# Patient Record
Sex: Female | Born: 1965
Health system: Southern US, Community
[De-identification: ages and names within clinical notes are randomized; demographics above are authoritative.]

## PROBLEM LIST (undated history)

## (undated) DIAGNOSIS — Z86718 Personal history of other venous thrombosis and embolism: Secondary | ICD-10-CM

## (undated) DIAGNOSIS — E739 Lactose intolerance, unspecified: Secondary | ICD-10-CM

## (undated) DIAGNOSIS — R12 Heartburn: Secondary | ICD-10-CM

## (undated) DIAGNOSIS — E538 Deficiency of other specified B group vitamins: Secondary | ICD-10-CM

## (undated) DIAGNOSIS — R079 Chest pain, unspecified: Secondary | ICD-10-CM

## (undated) DIAGNOSIS — M2550 Pain in unspecified joint: Secondary | ICD-10-CM

## (undated) DIAGNOSIS — E559 Vitamin D deficiency, unspecified: Secondary | ICD-10-CM

## (undated) DIAGNOSIS — F3281 Premenstrual dysphoric disorder: Secondary | ICD-10-CM

## (undated) DIAGNOSIS — M549 Dorsalgia, unspecified: Secondary | ICD-10-CM

## (undated) DIAGNOSIS — I10 Essential (primary) hypertension: Secondary | ICD-10-CM

## (undated) DIAGNOSIS — F419 Anxiety disorder, unspecified: Secondary | ICD-10-CM

## (undated) DIAGNOSIS — E669 Obesity, unspecified: Secondary | ICD-10-CM

## (undated) DIAGNOSIS — F32A Depression, unspecified: Secondary | ICD-10-CM

## (undated) DIAGNOSIS — M7989 Other specified soft tissue disorders: Secondary | ICD-10-CM

## (undated) DIAGNOSIS — F329 Major depressive disorder, single episode, unspecified: Secondary | ICD-10-CM

## (undated) DIAGNOSIS — R0602 Shortness of breath: Secondary | ICD-10-CM

## (undated) HISTORY — DX: Shortness of breath: R06.02

## (undated) HISTORY — DX: Major depressive disorder, single episode, unspecified: F32.9

## (undated) HISTORY — DX: Deficiency of other specified B group vitamins: E53.8

## (undated) HISTORY — DX: Dorsalgia, unspecified: M54.9

## (undated) HISTORY — DX: Anxiety disorder, unspecified: F41.9

## (undated) HISTORY — DX: Personal history of other venous thrombosis and embolism: Z86.718

## (undated) HISTORY — DX: Vitamin D deficiency, unspecified: E55.9

## (undated) HISTORY — DX: Essential (primary) hypertension: I10

## (undated) HISTORY — DX: Obesity, unspecified: E66.9

## (undated) HISTORY — DX: Premenstrual dysphoric disorder: F32.81

## (undated) HISTORY — DX: Other specified soft tissue disorders: M79.89

## (undated) HISTORY — DX: Heartburn: R12

## (undated) HISTORY — DX: Depression, unspecified: F32.A

## (undated) HISTORY — DX: Chest pain, unspecified: R07.9

## (undated) HISTORY — DX: Pain in unspecified joint: M25.50

## (undated) HISTORY — DX: Lactose intolerance, unspecified: E73.9

---

## 2003-02-24 ENCOUNTER — Ambulatory Visit (HOSPITAL_COMMUNITY): Admission: RE | Admit: 2003-02-24 | Discharge: 2003-02-24 | Payer: Self-pay | Admitting: Internal Medicine

## 2003-09-13 ENCOUNTER — Encounter: Admission: RE | Admit: 2003-09-13 | Discharge: 2003-09-13 | Payer: Self-pay | Admitting: General Surgery

## 2004-08-17 ENCOUNTER — Encounter: Admission: RE | Admit: 2004-08-17 | Discharge: 2004-08-17 | Payer: Self-pay | Admitting: General Surgery

## 2004-10-15 HISTORY — PX: GASTRIC BYPASS: SHX52

## 2004-12-15 ENCOUNTER — Other Ambulatory Visit: Admission: RE | Admit: 2004-12-15 | Discharge: 2004-12-15 | Payer: Self-pay | Admitting: Obstetrics and Gynecology

## 2005-05-10 ENCOUNTER — Encounter: Payer: Self-pay | Admitting: Cardiology

## 2005-09-10 ENCOUNTER — Encounter: Admission: RE | Admit: 2005-09-10 | Discharge: 2005-09-10 | Payer: Self-pay | Admitting: Obstetrics and Gynecology

## 2005-12-19 ENCOUNTER — Other Ambulatory Visit: Admission: RE | Admit: 2005-12-19 | Discharge: 2005-12-19 | Payer: Self-pay | Admitting: Obstetrics and Gynecology

## 2006-09-11 ENCOUNTER — Encounter: Admission: RE | Admit: 2006-09-11 | Discharge: 2006-09-11 | Payer: Self-pay | Admitting: Obstetrics and Gynecology

## 2006-12-27 ENCOUNTER — Encounter: Payer: Self-pay | Admitting: Cardiology

## 2007-10-07 ENCOUNTER — Encounter: Admission: RE | Admit: 2007-10-07 | Discharge: 2007-10-07 | Payer: Self-pay | Admitting: Obstetrics and Gynecology

## 2008-10-12 ENCOUNTER — Encounter: Admission: RE | Admit: 2008-10-12 | Discharge: 2008-10-12 | Payer: Self-pay | Admitting: Obstetrics and Gynecology

## 2009-10-31 ENCOUNTER — Encounter: Admission: RE | Admit: 2009-10-31 | Discharge: 2009-10-31 | Payer: Self-pay | Admitting: Obstetrics and Gynecology

## 2010-07-31 ENCOUNTER — Encounter (HOSPITAL_COMMUNITY)
Admission: RE | Admit: 2010-07-31 | Discharge: 2010-10-13 | Payer: Self-pay | Source: Home / Self Care | Attending: Neurology | Admitting: Neurology

## 2010-12-04 ENCOUNTER — Encounter: Payer: Self-pay | Admitting: Cardiology

## 2010-12-28 ENCOUNTER — Encounter: Payer: Self-pay | Admitting: Cardiology

## 2010-12-28 ENCOUNTER — Institutional Professional Consult (permissible substitution) (INDEPENDENT_AMBULATORY_CARE_PROVIDER_SITE_OTHER): Payer: BC Managed Care – PPO | Admitting: Cardiology

## 2010-12-28 DIAGNOSIS — R079 Chest pain, unspecified: Secondary | ICD-10-CM

## 2010-12-28 DIAGNOSIS — E669 Obesity, unspecified: Secondary | ICD-10-CM

## 2011-01-02 NOTE — Assessment & Plan Note (Signed)
Summary: consult: chestpain. notes on file from [facility]...   Visit Type:  Initial Consult Primary Provider:  Dr. Sherril Croon  CC:  some chest pressure and pt has no other complaints today.Marland Kitchen  History of Present Illness: Rebecca Wood is referred today for chest and left arm discomfort. This started about 6-8 weeks ago. It is described as a pressure slightly above the left breat. It can last for hours at times. Sometimes it is in the left arm as well. No associated nausea, vomiting, diaphoresis. No associated reflux symptoms. It is clearly not related to exertion. It eventually goes away on its own.  Her only significant risk factor is history of morbid obesity. She lost 125 lbs since gastric bypass. She is blessed with no hx of DM and her lipid profile has been remarkedly good. The last one I have was in 2006 with normal triglycerides and a HDL of 58.  Echo 12/04/10  was normal except for mil aortic insufficiency.  EKG  is normal.  Current Medications (verified): 1)  Multivitamins  Tabs (Multiple Vitamin) .... 2 Tabs  Once Daily 2)  Prilosec 20 Mg Cpdr (Omeprazole) .... Take 1 Tablet By Mouth Once A Day 3)  Ferrous Sulfate 325 (65 Fe) Mg Tabs (Ferrous Sulfate) .... Take 1 Tablet By Mouth Once A Day 4)  Citrucel  Powd (Methylcellulose (Laxative)) .... As Directed  Allergies (verified): 1)  ! Codeine   Review of Systems       negative other than HPI  Vital Signs:  Patient profile:   45 year old female Height:      63 inches Weight:      179.75 pounds BMI:     31.96 Pulse rate:   86 / minute Resp:     16 per minute BP sitting:   118 / 78  (left arm) Cuff size:   large  Vitals Entered By: Celestia Khat, CMA (December 28, 2010 3:28 PM)  Physical Exam  General:  obese.  NAD Head:  normocephalic and atraumatic Eyes:  PERRLA/EOM intact; conjunctiva and lids normal. Neck:  Neck supple, no JVD. No masses, thyromegaly or abnormal cervical nodes. Chest Rebecca Wood:  no deformities or  breast masses noted. no chest Rebecca Wood tenderness Lungs:  Clear bilaterally to auscultation and percussion. Heart:  RRR, NL S1 and S2, No murmur appreciated, no bruits Abdomen:  soft, ND,NT, no bruit Msk:  Back normal, normal gait. Muscle strength and tone normal. Pulses:  pulses normal in all 4 extremities Extremities:  No clubbing or cyanosis. Neurologic:  Alert and oriented x 3. Skin:  Intact without lesions or rashes. Psych:  Normal affect.   Impression & Recommendations:  Problem # 1:  CHEST PAIN UNSPECIFIED (ICD-786.50) Noncardiac, reassurance given. Education regarding angina or ischemic sxs given. Walking program and weight loss recommended. Orders: EKG w/ Interpretation (93000)  Patient Instructions: 1)  Your physician recommends that you schedule a follow-up appointment in: as needed with Dr. Daleen Squibb 2)  Your physician recommends that you continue on your current medications as directed. Please refer to the Current Medication list given to you today. 3)  Your physician discussed the importance of regular exercise and recommended that you start or continue a regular exercise program for good health. walk 30 minutes a day / 3 hours week 4)  Your physician encouraged you to lose weight for better health. A weight loss of 25 pounds or 1 pound per week

## 2011-03-02 NOTE — Consult Note (Signed)
NAME:  Kirk, Rebecca Wood NO.:  [id number]   MEDICAL RECORD NO.:  [id number]                  PATIENT TYPE:   LOCATION:                                       FACILITY:   PHYSICIAN:  Lionel December, M.D.                 DATE OF BIRTH:  1966/02/25   DATE OF CONSULTATION:  01/13/2003  DATE OF DISCHARGE:                                   CONSULTATION   REFERRING PHYSICIAN:  Dr. Weyman Pedro.   PRESENTING COMPLAINT:  Persistent symptoms of acid reflux.   HISTORY OF PRESENT ILLNESS:  The patient is a 45 year old Caucasian female  who is referred through the courtesy of Dr. Eliberto Ivory for GI evaluation.  She  gives a total of three-year history sour taste in her mouth, regurgitation,  and chest pain.  Symptoms have been occurring more and more frequently  lately.  She has taken Pepcid, Aciphex, Prevacid, and Protonix.  She  indicates her symptoms are never controlled to her satisfaction.  She thinks  Aciphex worked the best.  She has changed her eating habits completely.  She  states she has lost 30 pounds in the last six months.  Lately she has had  dysphagia with solids.  She also has regurgitation.  She describes chest  pain to be a heavy feeling that occurs virtually every day and lasts for  several minutes.  It is not associated with shortness of breath.  She also  has noted intermittent hoarseness and feels a frog in her throat.  She  does give a history of intermittent dark or black stools.  She denies  abdominal pain, odynophagia, nausea, or vomiting.  In November 2003 she had  normal upper abdominal ultrasound and a normal upper GI series performed at  [facility].   CURRENT MEDICATIONS:  1. MVI daily.  2. Wheat germ daily.  3. Bactrim one daily which she just started recently for acne.  4. Fat burner one daily which was begun two months ago and she does not feel     it is making her symptoms worse.  5. She occasionally uses hydrocodone for migraine.   PAST MEDICAL HISTORY:  She has acne and migraines.  She has never had any  surgeries.   ALLERGIES:  Rebecca Wood known.   FAMILY HISTORY:  Mother died of metastatic renal carcinoma at age 25.  She  died within a year of diagnosis.  Father is in fair health; so is one sister  and two brothers.   SOCIAL HISTORY:  She was married two years ago.  She has one stepson.  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  She has never smoked  cigarettes and does not drink alcohol.   PHYSICAL EXAMINATION:  GENERAL:  Pleasant, moderately-obese Caucasian female  who is in no acute distress.  VITAL SIGNS:  She weighs 240.5 pounds, she is 5 feet 3  inches tall.  Pulse  92 per minute, blood pressure 100/80, temperature 98.1.  HEENT:  Conjunctivae are pink, sclerae nonicteric.  Oropharyngeal mucosa is  normal, dentition in satisfactory condition.  NECK:  Without masses or thyromegaly.  CARDIAC:  Regular rhythm, normal S1 and S2.  No murmur or gallop noted.  LUNGS:  Clear to auscultation.  ABDOMEN:  Obese but soft without organomegaly or masses.  She has mild  midepigastric tenderness.  RECTAL:  Deferred.  EXTREMITIES:  She does not have peripheral edema or clubbing.   ASSESSMENT:  The patient is a 45 year old Caucasian female with total of  three-year history of symptoms typical of gastroesophageal reflux disease  who has not had significant relief with acid suppression and lifestyle  modifications.  Recent upper abdominal ultrasound and upper GI series were  within normal limits.  She also has dysphagia and therefore may have  developed a distal ring or a stricture.  As she has not responded to therapy  and she has dysphagia, one also has to be concerned about esophageal  motility disorder.  Even though upper GI series was normal her esophagus  needs to be examined by endoscopy to determine whether or not she has  mucosal disease.   RECOMMENDATIONS:  Antireflux measures reinforced.  Will give her  a trial  with Protonix 40 mg p.o. b.i.d. - two weeks supply given.  She will return  for EGD and dilatation to be performed at [facility].  Further recommendation will  be made based on endoscopic findings.  We will also give her hemoccult and  ask her to bring a stool smear when she notices black stools.   I would like to thank Dr. Eliberto Ivory for the opportunity to participate in the  care of this nice lady.                                               Lionel December, M.D.    NR/MEDQ  D:  01/13/2003  T:  01/14/2003  Job:  [id number]   cc:   Weyman Pedro, M.D.  [place], [place]   [facility]

## 2011-03-02 NOTE — Op Note (Signed)
NAME:  Rebecca Wood, Rebecca Wood                            ACCOUNT NO.:  [id number]   MEDICAL RECORD NO.:  [id number]                   PATIENT TYPE:  AMB   LOCATION:  DAY                                  FACILITY:  [facility]   PHYSICIAN:  Lionel December, M.D.                 DATE OF BIRTH:  18-Oct-1965   DATE OF PROCEDURE:  02/24/2003  DATE OF DISCHARGE:                                 OPERATIVE REPORT   PROCEDURE:  Esophagogastroduodenoscopy.   INDICATIONS FOR PROCEDURE:  The patient is a 45 year old Caucasian female  with symptoms of gastroesophageal reflux disease who has not responded to  therapy.  She has heartburn, chest pain, dysphagia, as well as nausea.  She  has been tried on multiple PPIs without good control of her  symptoms.  She is undergoing diagnostic EGD.  The procedure was reviewed  with the patient, and informed consent was obtained.   PREOPERATIVE MEDICATIONS:  Cetacaine spray for oropharyngeal topical  anesthesia, Demerol 50 mg IV, Versed 10 mg IV in divided doses.   INSTRUMENT USED:  Olympus video system.   FINDINGS:  The procedure was performed in the endoscopy suite.  The  patient's vital signs and O2 saturations were monitored during the procedure  and remained stable.  The patient was placed in the left lateral position.  The endoscope was passed via the oropharynx without any difficulty into the  esophagus.   Esophagus:  The mucosa of the esophagus was normal.  She had erythema at the  gastroesophageal junction.  No hernia, ring, or stricture was noted.  The  cervical esophagus was examined on the way out and appeared to be normal.   Stomach:  It was empty and distended very well with insufflation.  The folds  of the proximal stomach were normal.  Examination of the mucosa revealed  patchy erythema at the antrum, but no erosions or ulcers were noted.  The  pyloric channel was patent.  The angularis, fundus, and cardia were examined  by retroflexing the scope and were  normal.   Duodenum:  Examination of the bulb and postbulbar duodenum was normal.  The  endoscope was withdrawn.  The patient tolerated the procedure well.   The esophagus was dilated by passing a 56 [place] Maloney dilator through the  esophagus to full insertion, given history of dysphagia.  As the dilatation  was completed, the endoscope was passed again, and there was a small linear  tear in the cervical esophagus, indicating an unrecognized web.  Pictures  were taken for the record.  The endoscope was withdrawn.  The patient  tolerated the procedure well.   FINAL DIAGNOSES:  1. Margins of reflux esophagitis limited to gastroesophageal junction.  No     evidence of erosive esophagitis or hernia.  2. Nonerosive antral gastritis.  3. Esophageal dilatation resulted in small tear in cervical esophagus,  suggesting an esophageal web.   RECOMMENDATIONS:  1. She will continue antireflux measures and Nexium as before.  2. H pylori serology will be checked.  If it is positive, she will be     treated; otherwise, we will bring her back for a solid phase gastric     emptying study.                                               Lionel December, M.D.    NR/MEDQ  D:  02/24/2003  T:  02/25/2003  Job:  [id number]   cc:   Eliberto Ivory, M.D.  [place], [place]

## 2011-04-04 ENCOUNTER — Other Ambulatory Visit: Payer: Self-pay | Admitting: Obstetrics and Gynecology

## 2011-04-04 DIAGNOSIS — Z1231 Encounter for screening mammogram for malignant neoplasm of breast: Secondary | ICD-10-CM

## 2011-04-09 ENCOUNTER — Ambulatory Visit: Payer: BC Managed Care – PPO

## 2011-04-16 ENCOUNTER — Ambulatory Visit: Payer: BC Managed Care – PPO

## 2012-01-23 ENCOUNTER — Ambulatory Visit
Admission: RE | Admit: 2012-01-23 | Discharge: 2012-01-23 | Disposition: A | Discharge status: Home / Self Care | Payer: BC Managed Care – PPO | Source: Ambulatory Visit | Attending: Obstetrics and Gynecology | Admitting: Obstetrics and Gynecology

## 2012-01-23 DIAGNOSIS — Z1231 Encounter for screening mammogram for malignant neoplasm of breast: Secondary | ICD-10-CM

## 2012-04-21 DIAGNOSIS — E669 Obesity, unspecified: Secondary | ICD-10-CM | POA: Insufficient documentation

## 2012-04-21 DIAGNOSIS — F3281 Premenstrual dysphoric disorder: Secondary | ICD-10-CM | POA: Insufficient documentation

## 2012-04-23 ENCOUNTER — Ambulatory Visit: Payer: Self-pay | Admitting: Obstetrics and Gynecology

## 2012-12-15 ENCOUNTER — Encounter: Payer: Self-pay | Admitting: Obstetrics and Gynecology

## 2012-12-15 ENCOUNTER — Ambulatory Visit: Payer: BC Managed Care – PPO | Admitting: Obstetrics and Gynecology

## 2012-12-15 VITALS — BP 136/72 | Ht 62.75 in | Wt 186.0 lb

## 2012-12-15 DIAGNOSIS — Z01419 Encounter for gynecological examination (general) (routine) without abnormal findings: Secondary | ICD-10-CM

## 2012-12-15 DIAGNOSIS — Z9884 Bariatric surgery status: Secondary | ICD-10-CM

## 2012-12-15 DIAGNOSIS — E669 Obesity, unspecified: Secondary | ICD-10-CM

## 2012-12-15 DIAGNOSIS — R109 Unspecified abdominal pain: Secondary | ICD-10-CM

## 2012-12-15 DIAGNOSIS — Z124 Encounter for screening for malignant neoplasm of cervix: Secondary | ICD-10-CM

## 2012-12-15 DIAGNOSIS — R5383 Other fatigue: Secondary | ICD-10-CM

## 2012-12-15 NOTE — Progress Notes (Signed)
The patient reports: staying fatigued   Contraception:Husband had vasectomy  Last mammogram:01/24/2012 Last pap:04/09/2011  GC/Chlamydia cultures offered: declined HIV/RPR/HbsAg offered:  declined HSV 1 and 2 glycoprotein offered: declined  Menstrual cycle regular and monthly: yes pt states is noticing a little change. Pt states cycle in January was fine and February was off x 1 week. Pt states last June in came twice in that month. Menstrual flow normal: Yes  Urinary symptoms: has frequent UTIs  Normal bowel movements: Yes Reports abuse at home: No:   Subjective:    Rebecca Wood is a 47 y.o. female, G0P0, who presents for an annual exam.     History   Social History  . Marital Status: Married    Spouse Name: N/A    Number of Children: N/A  . Years of Education: N/A   Social History Main Topics  . Smoking status: Never Smoker   . Smokeless tobacco: Never Used  . Alcohol Use: No  . Drug Use: No  . Sexually Active: Yes    Birth Control/ Protection: Surgical     Comment: VAS   Other Topics Concern  . None   Social History Narrative  . None    Menstrual cycle:   LMP: Patient's last menstrual period was 12/08/2012.           Cycle: every 28 days, noticed about 3 times last year she had some irregularity. Cycle was 1 week late in February 2014. Cycle heaviest on first 2 days. Cramping in lower abdomin Pain scale 9/10, doesn't take any medication. When she has BM pain decreases. Dime size clotting.   The following portions of the patient's history were reviewed and updated as appropriate: allergies, current medications, past family history, past medical history, past social history, past surgical history and problem list.  Review of Systems Pertinent items are noted in HPI. Breast:Negative for breast lump,nipple discharge or nipple retraction Gastrointestinal: Negative for abdominal pain, change in bowel habits or rectal bleeding Urinary:negative   Objective:    BP  136/72  Ht 5' 2.75" (1.594 m)  Wt 186 lb (84.369 kg)  BMI 33.21 kg/m2  LMP 12/08/2012    Weight:  Wt Readings from Last 1 Encounters:  12/28/10 179 lb 12 oz (81.534 kg)          BMI: Body mass index is 33.21 kg/(m^2).  General Appearance: Alert, appropriate appearance for age. No acute distress HEENT: Grossly normal Neck / Thyroid: Supple, no masses, nodes or enlargement Lungs: clear to auscultation bilaterally Back: No CVA tenderness Breast Exam: No masses or nodes.No dimpling, nipple retraction or discharge. Cardiovascular: Regular rate and rhythm. S1, S2, no murmur Gastrointestinal: Soft, non-tender, no masses or organomegaly Pelvic Exam: Vulva and vagina appear normal. Bimanual exam reveals normal uterus and adnexa. Rectovaginal: normal rectal, no masses Lymphatic Exam: Non-palpable nodes in neck, clavicular, axillary, or inguinal regions  Skin: no rash or abnormalities Neurologic: Normal gait and speech, no tremor  Psychiatric: Alert and oriented, appropriate affect.   Assessment:    Normal gyn exam    Plan:    mammogram pap smear return annually or prn STD screening: declined Contraception:vasectomy Thyroid with TSH, Lipid Panel, Vitamin D, CMP, CBC Pelvic U/S at next visit for Pelvic pain Referral to Dr. Carollee Leitz Rivard MD

## 2012-12-16 LAB — PAP IG W/ RFLX HPV ASCU

## 2012-12-17 LAB — HUMAN PAPILLOMAVIRUS, HIGH RISK: HPV DNA High Risk: NOT DETECTED

## 2012-12-18 ENCOUNTER — Encounter: Payer: Self-pay | Admitting: Obstetrics and Gynecology

## 2012-12-18 NOTE — Progress Notes (Signed)
Quick Note:  Please send ASCUS letter. Pap in 1 year. ______ 

## 2012-12-26 ENCOUNTER — Other Ambulatory Visit: Payer: Self-pay | Admitting: Obstetrics and Gynecology

## 2012-12-26 DIAGNOSIS — R109 Unspecified abdominal pain: Secondary | ICD-10-CM

## 2012-12-26 DIAGNOSIS — R5381 Other malaise: Secondary | ICD-10-CM

## 2012-12-26 DIAGNOSIS — E669 Obesity, unspecified: Secondary | ICD-10-CM

## 2012-12-27 ENCOUNTER — Other Ambulatory Visit: Payer: Self-pay | Admitting: Obstetrics and Gynecology

## 2012-12-27 LAB — CBC
HCT: 39.8 % (ref 36.0–46.0)
Hemoglobin: 13.5 g/dL (ref 12.0–15.0)
MCH: 28.2 pg (ref 26.0–34.0)
MCHC: 33.9 g/dL (ref 30.0–36.0)
MCV: 83.3 fL (ref 78.0–100.0)
Platelets: 404 10*3/uL — ABNORMAL HIGH (ref 150–400)
RBC: 4.78 MIL/uL (ref 3.87–5.11)
RDW: 13.4 % (ref 11.5–15.5)
WBC: 6.3 10*3/uL (ref 4.0–10.5)

## 2012-12-27 LAB — LIPID PANEL
Cholesterol: 161 mg/dL (ref 0–200)
HDL: 62 mg/dL (ref >39)
LDL Cholesterol: 75 mg/dL (ref 0–99)
Total CHOL/HDL Ratio: 2.6 Ratio
Triglycerides: 120 mg/dL (ref <150)
VLDL: 24 mg/dL (ref 0–40)

## 2012-12-27 LAB — THYROID PANEL WITH TSH
Free Thyroxine Index: 2.7 (ref 1.0–3.9)
T3 Uptake: 32.8 % (ref 22.5–37.0)
T4, Total: 8.2 ug/dL (ref 5.0–12.5)
TSH: 1.054 u[IU]/mL (ref 0.350–4.500)

## 2013-01-09 ENCOUNTER — Other Ambulatory Visit: Payer: Self-pay | Admitting: Obstetrics and Gynecology

## 2013-01-09 LAB — COMPREHENSIVE METABOLIC PANEL
ALT: 22 U/L (ref 0–35)
AST: 23 U/L (ref 0–37)
Albumin: 4.1 g/dL (ref 3.5–5.2)
Alkaline Phosphatase: 77 U/L (ref 39–117)
BUN: 10 mg/dL (ref 6–23)
CO2: 29 mEq/L (ref 19–32)
Calcium: 8.7 mg/dL (ref 8.4–10.5)
Chloride: 104 mEq/L (ref 96–112)
Creat: 0.59 mg/dL (ref 0.50–1.10)
Glucose, Bld: 88 mg/dL (ref 70–99)
Potassium: 4.7 mEq/L (ref 3.5–5.3)
Sodium: 141 mEq/L (ref 135–145)
Total Bilirubin: 0.8 mg/dL (ref 0.3–1.2)
Total Protein: 6.5 g/dL (ref 6.0–8.3)

## 2013-01-10 LAB — VITAMIN D 25 HYDROXY (VIT D DEFICIENCY, FRACTURES): Vit D, 25-Hydroxy: 26 ng/mL — ABNORMAL LOW (ref 30–89)

## 2013-01-10 LAB — FOLATE: Folate: 13.7 ng/mL

## 2013-01-10 LAB — VITAMIN B12: Vitamin B-12: 594 pg/mL (ref 211–911)

## 2013-06-04 ENCOUNTER — Other Ambulatory Visit: Payer: Self-pay

## 2013-06-04 DIAGNOSIS — Z1231 Encounter for screening mammogram for malignant neoplasm of breast: Secondary | ICD-10-CM

## 2013-06-10 ENCOUNTER — Ambulatory Visit: Payer: BC Managed Care – PPO

## 2013-06-17 ENCOUNTER — Ambulatory Visit
Admission: RE | Admit: 2013-06-17 | Discharge: 2013-06-17 | Disposition: A | Discharge status: Home / Self Care | Payer: BC Managed Care – PPO | Source: Ambulatory Visit

## 2013-06-17 DIAGNOSIS — Z1231 Encounter for screening mammogram for malignant neoplasm of breast: Secondary | ICD-10-CM

## 2014-01-06 ENCOUNTER — Telehealth: Payer: Self-pay | Admitting: Internal Medicine

## 2014-01-06 NOTE — Telephone Encounter (Signed)
01-06-14 258pm, former pt of Dr. Daleen SquibbWall with recent abnormal echo, per Dr. Johney FrameAllred pt needs to re-est care with  A primary cardiologist, [place] in [place] so [facility], sent staff message to Carles Colleterri Stewart to make pt appt and [facility].

## 2014-01-27 ENCOUNTER — Other Ambulatory Visit: Payer: Self-pay | Admitting: General Surgery

## 2014-01-27 ENCOUNTER — Other Ambulatory Visit: Payer: Self-pay | Admitting: Endocrinology

## 2014-01-27 DIAGNOSIS — E211 Secondary hyperparathyroidism, not elsewhere classified: Secondary | ICD-10-CM

## 2014-01-27 DIAGNOSIS — E041 Nontoxic single thyroid nodule: Secondary | ICD-10-CM

## 2014-01-29 ENCOUNTER — Other Ambulatory Visit: Payer: Self-pay | Admitting: Endocrinology

## 2014-01-29 DIAGNOSIS — E211 Secondary hyperparathyroidism, not elsewhere classified: Secondary | ICD-10-CM

## 2014-02-02 ENCOUNTER — Inpatient Hospital Stay
Admission: RE | Admit: 2014-02-02 | Discharge: 2014-02-02 | Disposition: A | Discharge status: Home / Self Care | Payer: Self-pay | Source: Ambulatory Visit | Attending: Interventional Radiology | Admitting: Interventional Radiology

## 2014-02-02 ENCOUNTER — Other Ambulatory Visit (HOSPITAL_COMMUNITY): Payer: Self-pay | Admitting: Interventional Radiology

## 2014-02-02 DIAGNOSIS — E041 Nontoxic single thyroid nodule: Secondary | ICD-10-CM

## 2014-02-03 ENCOUNTER — Encounter (INDEPENDENT_AMBULATORY_CARE_PROVIDER_SITE_OTHER): Payer: Self-pay

## 2014-02-03 ENCOUNTER — Ambulatory Visit
Admission: RE | Admit: 2014-02-03 | Discharge: 2014-02-03 | Disposition: A | Discharge status: Home / Self Care | Payer: Self-pay | Source: Ambulatory Visit | Attending: Endocrinology | Admitting: Endocrinology

## 2014-02-03 ENCOUNTER — Other Ambulatory Visit (HOSPITAL_COMMUNITY)
Admission: RE | Admit: 2014-02-03 | Discharge: 2014-02-03 | Disposition: A | Discharge status: Home / Self Care | Payer: BC Managed Care – PPO | Source: Ambulatory Visit | Attending: Interventional Radiology | Admitting: Interventional Radiology

## 2014-02-03 ENCOUNTER — Ambulatory Visit
Admission: RE | Admit: 2014-02-03 | Discharge: 2014-02-03 | Disposition: A | Discharge status: Home / Self Care | Payer: BC Managed Care – PPO | Source: Ambulatory Visit | Attending: Endocrinology | Admitting: Endocrinology

## 2014-02-03 DIAGNOSIS — E211 Secondary hyperparathyroidism, not elsewhere classified: Secondary | ICD-10-CM

## 2014-02-03 DIAGNOSIS — E041 Nontoxic single thyroid nodule: Secondary | ICD-10-CM

## 2014-02-05 ENCOUNTER — Encounter: Payer: Self-pay | Admitting: Cardiology

## 2014-02-05 ENCOUNTER — Ambulatory Visit (INDEPENDENT_AMBULATORY_CARE_PROVIDER_SITE_OTHER): Payer: BC Managed Care – PPO | Admitting: Cardiology

## 2014-02-05 VITALS — BP 124/72 | HR 84 | Ht 62.75 in | Wt 198.0 lb

## 2014-02-05 DIAGNOSIS — R222 Localized swelling, mass and lump, trunk: Secondary | ICD-10-CM

## 2014-02-05 DIAGNOSIS — Z136 Encounter for screening for cardiovascular disorders: Secondary | ICD-10-CM

## 2014-02-05 DIAGNOSIS — Z13220 Encounter for screening for lipoid disorders: Secondary | ICD-10-CM

## 2014-02-05 DIAGNOSIS — R079 Chest pain, unspecified: Secondary | ICD-10-CM

## 2014-02-05 DIAGNOSIS — R002 Palpitations: Secondary | ICD-10-CM

## 2014-02-05 DIAGNOSIS — I5189 Other ill-defined heart diseases: Secondary | ICD-10-CM

## 2014-02-05 NOTE — Patient Instructions (Signed)
Your physician has recommended that you wear a holter monitor. Holter monitors are medical devices that record the heart's electrical activity. Doctors most often use these monitors to diagnose arrhythmias. Arrhythmias are problems with the speed or rhythm of the heartbeat. The monitor is a small, portable device. You can wear one while you do your normal daily activities. This is usually used to diagnose what is causing palpitations/syncope (passing out).  Your physician has requested that you have a cardiac MRI. Cardiac MRI uses a computer to create images of your heart as its beating, producing both still and moving pictures of your heart and major blood vessels. For further information please visit [email or web address]. Please follow the instruction sheet given to you today for more information.  Your physician recommends that you return for a FASTING NMR LIPO WITH LIPIDS on  Your physician wants you to follow-up in: 1 month with Dr. Shirlee LatchMclean. You will receive a reminder letter in the mail two months in advance. If you don't receive a letter, please call our office to schedule the follow-up appointment.

## 2014-02-07 NOTE — Progress Notes (Signed)
Patient ID: Rebecca LodgeRobin Wood, female   DOB: 05/07/1966, 48 y.o.   MRN: [id number] PCP: Dr. Sherril CroonVyas  48 yo with history of prior gastric bypass and prior workup for atypical chest pain presents for cardiology followup after having and abnormal echo.  She has been seen by Dr. Daleen SquibbWall in the past and is seen by me for the first time today.  She had an echo done in 3/15, apparently because of mild aortic insufficiency noted on an echo done in 2/12.  This was not done at our office so I do not have the images to review.  The report mentions a "hyperechoic density in the left atrium."  It was recommended that she have further workup of this.  She has not history of stroke-like or TIA-like symptoms.   She has mild exertional dyspnea walking up steps.  She has generalized fatigue.  She has occasional palpitations with HR increasing to 100s-120s at times.  No lightheadedness or syncope.  She does not typically have exertional chest pain.  She did have 1 recent episode of chest pain after sex.    ECG: NSR, normal  Labs (3/15): TSH normal, K 4.8, creatinine 0.67, HCT 38.4, LDL 72, HDL 60  PMH: 1. Obesity s/p gastric bypass 2. GERD 3. Echo (2/12) with EF 60-65%, mild AI.  Echo (3/15) with EF 60-65%, mild AI, hyperechoic density in left atrium.  4. Elevated PTH level with low normal calcium.  5. History of atypical chest pain  SH: Married, [place], nonsmoker.  FH: Father with MI at 5753, brother with MI in his 4230s.   ROS: All systems reviewed and negative except as per HPI.   Current Outpatient Prescriptions  Medication Sig Dispense Refill  . Calcium-Magnesium-Vitamin D (CALCIUM 500 PO) Take by mouth 3 (three) times daily.      . Cholecalciferol (VITAMIN D-3 PO) Take 200 mg by mouth 3 (three) times daily.      . ferrous sulfate 325 (65 FE) MG EC tablet Take 325 mg by mouth daily with breakfast.       . Methylcellulose, Laxative, (CITRUCEL PO) Take by mouth 3 (three) times daily as needed.      .  Multiple Vitamin (MULTIVITAMIN) tablet Take 1 tablet by mouth 2 (two) times daily.      Marland Kitchen. omeprazole (PRILOSEC) 10 MG capsule Take 20 mg by mouth daily.       No current facility-administered medications for this visit.    BP 124/72  Pulse 84  Ht 5' 2.75" (1.594 m)  Wt 89.812 kg (198 lb)  BMI 35.35 kg/m2 General: NAD Neck: No JVD, no thyromegaly or thyroid nodule.  Lungs: Clear to auscultation bilaterally with normal respiratory effort. CV: Nondisplaced PMI.  Heart regular S1/S2, no S3/S4, no murmur.  No peripheral edema.  No carotid bruit.  Normal pedal pulses.  Abdomen: Soft, nontender, no hepatosplenomegaly, no distention.  Skin: Intact without lesions or rashes.  Neurologic: Alert and oriented x 3.  Psych: Normal affect. Extremities: No clubbing or cyanosis.  HEENT: Normal.   Assessment/Plan: 1. Echo abnormality: Last echo done at outside facility showed "hyperechoic density in the left atrium."  Further workup was recommended.  I do not have the images to review.  This may be artifact but cannot be sure.  She has had no symptoms suggestive of TIA or stroke.  No history of atrial fibrillation to suggest LA thrombus.  - I will get a cardiac MRI to assess for mass/thrombus in the left  atrium.  - In the future, I told her that if echo is needed, should be done in this office so that images can be directly reviewed.  2. Palpitations: I will have her wear a 48 hour holter to see if she has arrhythmia such as atrial fibrillation.  She has some palpitations on most days.   3. Family history of premature CAD: Father and brother.  LDL was not high when recently checked.  Given her family history, I will get a Lipomed profile to assess LDL particle number.    Followup in 1 month.   Laurey MoraleDalton S McLean 02/08/2014

## 2014-02-08 DIAGNOSIS — R002 Palpitations: Secondary | ICD-10-CM | POA: Insufficient documentation

## 2014-02-08 DIAGNOSIS — I5189 Other ill-defined heart diseases: Secondary | ICD-10-CM | POA: Insufficient documentation

## 2014-02-11 ENCOUNTER — Encounter: Payer: Self-pay | Admitting: Cardiology

## 2014-02-11 ENCOUNTER — Encounter: Payer: Self-pay | Admitting: *Deleted

## 2014-02-11 ENCOUNTER — Other Ambulatory Visit: Payer: BC Managed Care – PPO

## 2014-02-11 ENCOUNTER — Encounter (INDEPENDENT_AMBULATORY_CARE_PROVIDER_SITE_OTHER): Payer: BC Managed Care – PPO

## 2014-02-11 DIAGNOSIS — Z136 Encounter for screening for cardiovascular disorders: Principal | ICD-10-CM

## 2014-02-11 DIAGNOSIS — Z13220 Encounter for screening for lipoid disorders: Secondary | ICD-10-CM

## 2014-02-11 DIAGNOSIS — R002 Palpitations: Secondary | ICD-10-CM

## 2014-02-11 NOTE — Progress Notes (Signed)
Patient ID: Rebecca LodgeRobin Wood, female   DOB: 1966/09/13, 48 y.o.   MRN: [id number] EVO 48 hour holter monitor applied to patient.

## 2014-02-12 LAB — NMR LIPOPROFILE WITH LIPIDS
Cholesterol, Total: 168 mg/dL (ref <200)
HDL Particle Number: 41.6 umol/L (ref >=30.5)
HDL Size: 9.9 nm (ref >=9.2)
HDL-C: 75 mg/dL (ref >=40)
LDL (calc): 67 mg/dL (ref <100)
LDL Particle Number: 706 nmol/L (ref <1000)
LDL Size: 21.4 nm (ref >20.5)
LP-IR Score: 31 (ref <=45)
Large HDL-P: 12.7 umol/L (ref >=4.8)
Large VLDL-P: 4.4 nmol/L — ABNORMAL HIGH (ref <=2.7)
Small LDL Particle Number: 248 nmol/L (ref <=527)
Triglycerides: 130 mg/dL (ref <150)
VLDL Size: 49.6 nm — ABNORMAL HIGH (ref <=46.6)

## 2014-02-17 ENCOUNTER — Ambulatory Visit (HOSPITAL_COMMUNITY)
Admission: RE | Admit: 2014-02-17 | Discharge: 2014-02-17 | Disposition: A | Discharge status: Home / Self Care | Payer: BC Managed Care – PPO | Source: Ambulatory Visit | Attending: Cardiology | Admitting: Cardiology

## 2014-02-17 DIAGNOSIS — R079 Chest pain, unspecified: Secondary | ICD-10-CM

## 2014-02-17 DIAGNOSIS — I5189 Other ill-defined heart diseases: Secondary | ICD-10-CM

## 2014-02-17 LAB — CREATININE, SERUM
Creatinine, Ser: 0.68 mg/dL (ref 0.50–1.10)
GFR calc Af Amer: >90 mL/min (ref >90)
GFR calc non Af Amer: >90 mL/min (ref >90)

## 2014-02-17 MED ORDER — GADOBENATE DIMEGLUMINE 529 MG/ML IV SOLN: 30.0000 mL | Freq: Once | INTRAVENOUS | Status: DC

## 2014-02-18 ENCOUNTER — Telehealth: Payer: Self-pay | Admitting: *Deleted

## 2014-02-18 NOTE — Telephone Encounter (Signed)
Message copied by Jacqlyn KraussLANKFORD, ANNE M on Thu Feb 18, 2014  8:57 AM ------      Message from: Sherrilyn RistAVIS, GESILA C      Created: Thu Feb 18, 2014  8:16 AM      Regarding: FW: CARDIAC MRI       Thurston Holenne, please read email below.             gesila       ----- Message -----         From: Terri PiedraJulia Lynette Thomas Piercy         Sent: 02/17/2014  10:45 AM           To: Sherrilyn RistGesila C Davis      Subject: RE: CARDIAC MRI                                          Hey girl,  Alexandria LodgeRobin Santoro came this morning and attempted the MRI--she is claustrophobic and we rescheduled to 5/19 at 3pm.   This was the earliest we could  Do based on our and her schedule(has beach trip week before)  She will need meds to take prior if you get her an order for something.  She also has an appt schedule with Dr. Shirlee LatchMclean on the 28th and may to need to reschedule that since she will not have had this yet.  She will be in touch with the office.  Thanks Lynnette      ----- Message -----         From: Sherrilyn RistGesila C Davis         Sent: 02/09/2014   4:16 PM           To: Terri PiedraJulia Lynette Thomas Piercy      Subject: FW: CARDIAC MRI                                          Mclean: 5/6, 5/29, 6/2, 6/3, 6/8, 6/24, 6/26, 6/29, 7/1, 8/10, 8/11, 8/18,             gesila        ----- Message -----         From: Gerome Sameborah D Miller         Sent: 02/05/2014   4:20 PM           To: Sherrilyn RistGesila C Davis, [facility] [address] [facility] [address] Pre Cert/Auth      Subject: CARDIAC MRI                                              02/05/14 See orders.            Thanks!                   ------

## 2014-02-18 NOTE — Telephone Encounter (Signed)
I will forward to Dr Shirlee LatchMcLean for order for medication prior to Cardiac MRI--pt claustrophobic.

## 2014-02-18 NOTE — Telephone Encounter (Signed)
Pt.notified

## 2014-02-18 NOTE — Telephone Encounter (Signed)
Can have Valium 2.5 mg x 1 to take 1/2 hour before MRI.

## 2014-02-22 ENCOUNTER — Telehealth: Payer: Self-pay | Admitting: Cardiology

## 2014-02-22 NOTE — Telephone Encounter (Signed)
Dr. Alford HighlandMclean's Interpretation of 48 hour holter monitor: No significant abnormalities.  Pt aware.

## 2014-03-05 ENCOUNTER — Ambulatory Visit: Payer: BC Managed Care – PPO | Admitting: Cardiology

## 2014-03-11 ENCOUNTER — Ambulatory Visit: Payer: BC Managed Care – PPO | Admitting: Cardiology

## 2014-03-12 ENCOUNTER — Ambulatory Visit (HOSPITAL_COMMUNITY)
Admission: RE | Admit: 2014-03-12 | Discharge: 2014-03-12 | Disposition: A | Discharge status: Home / Self Care | Payer: BC Managed Care – PPO | Source: Ambulatory Visit | Attending: Cardiology | Admitting: Cardiology

## 2014-03-12 DIAGNOSIS — R079 Chest pain, unspecified: Secondary | ICD-10-CM

## 2014-03-12 DIAGNOSIS — I359 Nonrheumatic aortic valve disorder, unspecified: Secondary | ICD-10-CM | POA: Insufficient documentation

## 2014-03-12 MED ORDER — GADOBENATE DIMEGLUMINE 529 MG/ML IV SOLN
30.0000 mL | Freq: Once | INTRAVENOUS | Status: AC
  Administered 2014-03-12: 30 mL via INTRAVENOUS

## 2014-03-15 ENCOUNTER — Encounter: Payer: Self-pay | Admitting: Cardiology

## 2014-03-15 ENCOUNTER — Ambulatory Visit (INDEPENDENT_AMBULATORY_CARE_PROVIDER_SITE_OTHER): Payer: BC Managed Care – PPO | Admitting: Cardiology

## 2014-03-15 VITALS — BP 130/78 | HR 76 | Ht 63.0 in | Wt 199.0 lb

## 2014-03-15 DIAGNOSIS — R002 Palpitations: Secondary | ICD-10-CM

## 2014-03-15 DIAGNOSIS — I5189 Other ill-defined heart diseases: Secondary | ICD-10-CM

## 2014-03-15 NOTE — Patient Instructions (Signed)
Your physician recommends that you schedule a follow-up appointment as needed with Dr McLean.  

## 2014-03-15 NOTE — Progress Notes (Signed)
Patient ID: Rebecca Wood, female   DOB: 1966/06/22, 48 y.o.   MRN: [id number] PCP: Dr. Sherril Croon  48 y.o. with history of prior gastric bypass and prior workup for atypical chest pain was seen at last appointment for cardiology followup after having had an abnormal echo. She had an echo done in 3/15, apparently because of mild aortic insufficiency noted on an echo done in 2/12.  This was not done at our office so I do not have the images to review.  The report mentions a "hyperechoic density in the left atrium."  It was recommended that she have further workup of this.  She has not history of stroke-like or TIA-like symptoms.   She has mild exertional dyspnea walking up steps.  She has generalized fatigue.  She has occasional palpitations with HR increasing to 100s-120s at times.  No lightheadedness or syncope.  She does not typically have exertional chest pain.    Cardiac MRI was done to evaluate possible LA mass.  There was no evidence for mass, aortic insufficiency was mild. Holter monitor showed no significant abnormalities.   Labs (3/15): TSH normal, K 4.8, creatinine 0.67, HCT 38.4, LDL 72, HDL 60 Labs (5/15): LDL particle number 706, LDL 67  PMH: 1. Obesity s/p gastric bypass 2. GERD 3. Echo (2/12) with EF 60-65%, mild AI.  Echo (3/15) with EF 60-65%, mild AI, hyperechoic density in left atrium.  Cardiac MRI (5/15) with EF 61%, mild AI, no left atrial mass.  4. Elevated PTH level with low normal calcium.  5. History of atypical chest pain 6. Palpitations: Holter (4/15) with no significant abnormality.   SH: Married, lives in [place], nonsmoker.  FH: Father with MI at 57, brother with MI in his 59s.   ROS: All systems reviewed and negative except as per HPI.   Current Outpatient Prescriptions  Medication Sig Dispense Refill  . Calcium-Magnesium-Vitamin D (CALCIUM 500 PO) Take by mouth 3 (three) times daily.      . Cholecalciferol (VITAMIN D-3 PO) Take 200 mg by mouth 3 (three) times daily.       . diazepam (VALIUM) 2 MG tablet 1 tablet 1/2 hour before MRI scan  1 tablet  0  . ferrous sulfate 325 (65 FE) MG EC tablet Take 325 mg by mouth daily with breakfast.       . Methylcellulose, Laxative, (CITRUCEL PO) Take by mouth 3 (three) times daily as needed.      . Multiple Vitamin (MULTIVITAMIN) tablet Take 1 tablet by mouth 2 (two) times daily.      Marland Kitchen omeprazole (PRILOSEC) 10 MG capsule Take 20 mg by mouth daily.       No current facility-administered medications for this visit.    BP 130/78  Pulse 76  Ht 5\' 3"  (1.6 m)  Wt 90.266 kg (199 lb)  BMI 35.26 kg/m2 General: NAD Neck: No JVD, no thyromegaly or thyroid nodule.  Lungs: Clear to auscultation bilaterally with normal respiratory effort. CV: Nondisplaced PMI.  Heart regular S1/S2, no S3/S4, no murmur.  No peripheral edema.  No carotid bruit.  Normal pedal pulses.  Abdomen: Soft, nontender, no hepatosplenomegaly, no distention.  Skin: Intact without lesions or rashes.  Neurologic: Alert and oriented x 3.  Psych: Normal affect. Extremities: No clubbing or cyanosis.   Assessment/Plan: 1. Echo abnormality: Last echo done at outside facility showed "hyperechoic density in the left atrium."  Further workup was recommended.  She has had no symptoms suggestive of TIA or stroke.  No  history of atrial fibrillation to suggest LA thrombus.  Cardiac MRI showed no mass.  I suspect that the echo finding was artifactual.   In the future, I told her that if echo is needed, should be done in this office so that images can be directly reviewed.  She does not need another echo unless symptoms change or murmur gets louder.  2. Palpitations: No significant arrhythmias on holter.  3. Family history of premature CAD: Father and brother.  LDL particle number and LDL both excellent when checked in 5/15.   Laurey MoraleDalton S Akio Hudnall 03/15/2014

## 2014-03-16 ENCOUNTER — Other Ambulatory Visit: Payer: Self-pay | Admitting: Endocrinology

## 2014-03-16 DIAGNOSIS — E041 Nontoxic single thyroid nodule: Secondary | ICD-10-CM

## 2015-01-24 ENCOUNTER — Other Ambulatory Visit: Payer: BC Managed Care – PPO

## 2015-04-29 ENCOUNTER — Other Ambulatory Visit: Payer: Self-pay

## 2015-04-29 DIAGNOSIS — Z1231 Encounter for screening mammogram for malignant neoplasm of breast: Secondary | ICD-10-CM

## 2015-09-21 ENCOUNTER — Other Ambulatory Visit: Payer: Self-pay | Admitting: Endocrinology

## 2015-09-21 DIAGNOSIS — E041 Nontoxic single thyroid nodule: Secondary | ICD-10-CM

## 2015-10-12 ENCOUNTER — Other Ambulatory Visit: Payer: BC Managed Care – PPO

## 2015-10-28 ENCOUNTER — Ambulatory Visit
Admission: RE | Admit: 2015-10-28 | Discharge: 2015-10-28 | Disposition: A | Discharge status: Home / Self Care | Payer: BLUE CROSS/BLUE SHIELD | Source: Ambulatory Visit | Attending: Endocrinology | Admitting: Endocrinology

## 2015-10-28 DIAGNOSIS — E041 Nontoxic single thyroid nodule: Secondary | ICD-10-CM

## 2016-02-07 DIAGNOSIS — K219 Gastro-esophageal reflux disease without esophagitis: Secondary | ICD-10-CM | POA: Diagnosis not present

## 2016-02-07 DIAGNOSIS — Z1211 Encounter for screening for malignant neoplasm of colon: Secondary | ICD-10-CM | POA: Diagnosis not present

## 2016-02-07 DIAGNOSIS — R1310 Dysphagia, unspecified: Secondary | ICD-10-CM | POA: Diagnosis not present

## 2016-02-15 DIAGNOSIS — F411 Generalized anxiety disorder: Secondary | ICD-10-CM | POA: Diagnosis not present

## 2016-02-23 DIAGNOSIS — L738 Other specified follicular disorders: Secondary | ICD-10-CM | POA: Diagnosis not present

## 2016-02-23 DIAGNOSIS — D2262 Melanocytic nevi of left upper limb, including shoulder: Secondary | ICD-10-CM | POA: Diagnosis not present

## 2016-05-07 DIAGNOSIS — K209 Esophagitis, unspecified: Secondary | ICD-10-CM | POA: Diagnosis not present

## 2016-05-07 DIAGNOSIS — R1310 Dysphagia, unspecified: Secondary | ICD-10-CM | POA: Diagnosis not present

## 2016-05-07 DIAGNOSIS — Z1211 Encounter for screening for malignant neoplasm of colon: Secondary | ICD-10-CM | POA: Diagnosis not present

## 2016-05-07 DIAGNOSIS — K219 Gastro-esophageal reflux disease without esophagitis: Secondary | ICD-10-CM | POA: Diagnosis not present

## 2016-05-07 DIAGNOSIS — K228 Other specified diseases of esophagus: Secondary | ICD-10-CM | POA: Diagnosis not present

## 2016-05-11 DIAGNOSIS — F411 Generalized anxiety disorder: Secondary | ICD-10-CM | POA: Diagnosis not present

## 2016-08-08 DIAGNOSIS — F411 Generalized anxiety disorder: Secondary | ICD-10-CM | POA: Diagnosis not present

## 2016-10-09 DIAGNOSIS — R079 Chest pain, unspecified: Secondary | ICD-10-CM | POA: Diagnosis not present

## 2016-10-09 DIAGNOSIS — I10 Essential (primary) hypertension: Secondary | ICD-10-CM | POA: Diagnosis not present

## 2016-10-11 ENCOUNTER — Encounter: Payer: Self-pay | Admitting: Physician Assistant

## 2016-10-11 ENCOUNTER — Ambulatory Visit (INDEPENDENT_AMBULATORY_CARE_PROVIDER_SITE_OTHER): Payer: BLUE CROSS/BLUE SHIELD | Admitting: Physician Assistant

## 2016-10-11 VITALS — BP 150/90 | HR 84 | Ht 63.0 in | Wt 219.6 lb

## 2016-10-11 DIAGNOSIS — I10 Essential (primary) hypertension: Secondary | ICD-10-CM | POA: Diagnosis not present

## 2016-10-11 DIAGNOSIS — R0602 Shortness of breath: Secondary | ICD-10-CM

## 2016-10-11 DIAGNOSIS — Z8249 Family history of ischemic heart disease and other diseases of the circulatory system: Secondary | ICD-10-CM

## 2016-10-11 DIAGNOSIS — R079 Chest pain, unspecified: Secondary | ICD-10-CM

## 2016-10-11 MED ORDER — ASPIRIN EC 81 MG PO TBEC: 81.0000 mg | DELAYED_RELEASE_TABLET | Freq: Every day | ORAL | Status: DC

## 2016-10-11 NOTE — Patient Instructions (Addendum)
Medication Instructions:  START Aspirin 81 mg by mouth daily.   Labwork: NONE  Testing/Procedures: Your physician has requested that you have an echocardiogram. Echocardiography is a painless test that uses sound waves to create images of your heart. It provides your doctor with information about the size and shape of your heart and how well your heart's chambers and valves are working. This procedure takes approximately one hour. There are no restrictions for this procedure.  Your physician has requested that you have en exercise stress myoview. For further information please visit [email or web address]. Please follow instruction sheet, as given.    Follow-Up: Your physician recommends that you schedule a follow-up appointment in: 2-3 WEEKS WITH VIN BHAGAT, PAC   Any Other Special Instructions Will Be Listed Below (If Applicable).     If you need a refill on your cardiac medications before your next appointment, please call your pharmacy.

## 2016-10-11 NOTE — Progress Notes (Signed)
Cardiology Office Note    Date:  10/11/2016   ID:  Rebecca Wood, DOB [birth year], MRN [id number]  PCP:  Frederich ChickWEBB, CAROL D, MD  Cardiologist: Dr. Shirlee LatchMcLean  Chief Complaint: Chest pain  History of Present Illness:   Rebecca Wood is a 50 y.o. female with history of prior gastric bypass, HTN, palpitations, GERD, atypical chest pain and outside abnormal echo who presented for chest pain evaluation.   Prior hx of atypical chest pain. F/u outside echo was abnormal.  The report mentions a "hyperechoic density in the left atrium."  It was recommended that she have further workup of this. Cardiac MRI was done to evaluate possible LA mass.  There was no evidence for mass, aortic insufficiency was mild. Holter monitor for palpitations showed no significant abnormalities.   Father with MI at 5853, brother with MI in his 5730s.   Patient was seen by Dr. Hyman HopesWebb to establish care. Noted chest pain when stressed out. Given strong family hx of CAD referred to cardiology for further evaluation. Also amlodipine increase to 10mg  qd for elevated blood pressure.   Here today for follow up. She hasn't started increased dose of amlodipine yet. BP in 140-160s/90-100s. 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 Noted substernal chest pressure for the past 2-3 months. Its been intermittent lasting for seconds to minutes. Occurs under stress and without stress. Also have dyspnea with exertions. Both been getting worse. No radiation, diaphoresis, nausea, vomiting. No syncope, dizziness, LE edema, orthopnea, PND or melena. Does not feel like GERD. No palpitations.    Past Medical History:  Diagnosis Date  . Depression   . Hypertension, essential   . Obesity   . PMDD (premenstrual dysphoric disorder)     Past Surgical History:  Procedure Laterality Date  . GASTRIC BYPASS  2006    Current Medications: Prior to Admission medications   Medication Sig Start Date End Date Taking? Authorizing Provider    Calcium-Magnesium-Vitamin D (CALCIUM 500 PO) Take by mouth 3 (three) times daily.    Historical Provider, MD  Cholecalciferol (VITAMIN D-3 PO) Take 200 mg by mouth 3 (three) times daily.    Historical Provider, MD  diazepam (VALIUM) 2 MG tablet 1 tablet 1/2 hour before MRI scan 02/18/14   Laurey Moralealton S McLean, MD  ferrous sulfate 325 (65 FE) MG EC tablet Take 325 mg by mouth daily with breakfast.     Historical Provider, MD  Methylcellulose, Laxative, (CITRUCEL PO) Take by mouth 3 (three) times daily as needed.    Historical Provider, MD  Multiple Vitamin (MULTIVITAMIN) tablet Take 1 tablet by mouth 2 (two) times daily.    Historical Provider, MD  omeprazole (PRILOSEC) 10 MG capsule Take 20 mg by mouth daily.    Historical Provider, MD    Allergies:   Codeine   Social History   Social History  . Marital status: Married    Spouse name: N/A  . Number of children: N/A  . Years of education: N/A   Social History Main Topics  . Smoking status: Never Smoker  . Smokeless tobacco: Never Used  . Alcohol use Yes     Comment: occasional liquor  . Drug use: No  . Sexual activity: Yes    Birth control/ protection: Surgical     Comment: VAS   Other Topics Concern  . None   Social History Narrative  . None     Family History:  The patient's family history includes Cancer (age of onset: 2862)  in her mother; Depression in her brother; Heart attack in her brother; Heart attack (age of onset: 8065) in her father.   ROS:   Please see the history of present illness.    ROS All other systems reviewed and are negative.   PHYSICAL EXAM:   VS:  BP (!) 150/90 (BP Location: Right Arm, Patient Position: Sitting, Cuff Size: Large)   Pulse 84   Ht 5\' 3"  (1.6 m)   Wt 219 lb 9.6 oz (99.6 kg)   BMI 38.90 kg/m    GEN: Well nourished, well developed, in no acute distress  HEENT: normal  Neck: no JVD, carotid bruits, or masses Cardiac: RRR; no murmurs, rubs, or gallops,no edema  Respiratory:  clear to  auscultation bilaterally, normal work of breathing GI: soft, nontender, nondistended, + BS MS: no deformity or atrophy  Skin: warm and dry, no rash Neuro:  Alert and Oriented x 3, Strength and sensation are intact Psych: euthymic mood, full affect  Wt Readings from Last 3 Encounters:  10/11/16 219 lb 9.6 oz (99.6 kg)  03/15/14 199 lb (90.3 kg)  02/05/14 198 lb (89.8 kg)      Studies/Labs Reviewed:   EKG:  EKG is not ordered today.    EKG for PCP 10/09/16 NSR at rate of 70 bpm.   Recent Labs: No results found for requested labs within last 8760 hours.   Lipid Panel    Component Value Date/Time   CHOL 168 02/11/2014 0913   TRIG 130 02/11/2014 0913   HDL 75 02/11/2014 0913   CHOLHDL 2.6 12/27/2012 0843   VLDL 24 12/27/2012 0843   LDLCALC 67 02/11/2014 0913    Additional studies/ records that were reviewed today include:   Echo (2/12) with EF 60-65%, mild AI.  Echo (3/15) with EF 60-65%, mild AI, hyperechoic density in left atrium.  Cardiac MRI (5/15) with EF 61%, mild AI, no left atrial mass.   Holter (4/15) with no significant abnormality.   ASSESSMENT & PLAN:    1. Chest pressure - Occurs under stress and with exertion. As well as without both of these. Her cardiac risk factors includes uncontrolled HTN, strong family hx of early CAD and obesity. Will get exercise myoivew.She states that she can walk on treadmill. Start ASA 81mg .  2. Exertional dyspnea - this been getting worse. Prior outside abnormal echo. F/u cardiac MRI was normal. However given recent symptoms will update echo.   3. Uncontrolled HTN - Advised to start higher dose of amlodipine 10mg  qd. She will give us call if still above 140/90s.    Medication Adjustments/Labs and Tests Ordered: Current medicines are reviewed at length with the patient today.  Concerns regarding medicines are outlined above.  Medication changes, Labs and Tests ordered today are listed in the Patient Instructions  below. There are no Patient Instructions on file for this visit.   Lorelei PontSigned, Rebecca Wood, [place]  10/11/2016 10:23 AM    [facility] [address], [place], [place]  [address] Phone: [phone]; Fax: [phone]

## 2016-10-12 ENCOUNTER — Other Ambulatory Visit: Payer: Self-pay | Admitting: *Deleted

## 2016-10-12 DIAGNOSIS — R079 Chest pain, unspecified: Secondary | ICD-10-CM

## 2016-10-12 NOTE — Progress Notes (Signed)
[facility]; new order was placed in [facility] today. I was not aware pt was having done at [facility]. Original order has been cancelled.

## 2016-10-17 ENCOUNTER — Encounter (HOSPITAL_COMMUNITY)
Admission: RE | Admit: 2016-10-17 | Discharge: 2016-10-17 | Disposition: A | Discharge status: Home / Self Care | Payer: BLUE CROSS/BLUE SHIELD | Source: Ambulatory Visit | Attending: Physician Assistant | Admitting: Physician Assistant

## 2016-10-17 ENCOUNTER — Encounter (HOSPITAL_COMMUNITY): Payer: Self-pay

## 2016-10-17 ENCOUNTER — Ambulatory Visit (HOSPITAL_COMMUNITY): Admission: RE | Admit: 2016-10-17 | Payer: BLUE CROSS/BLUE SHIELD | Source: Ambulatory Visit

## 2016-10-17 DIAGNOSIS — R0602 Shortness of breath: Secondary | ICD-10-CM

## 2016-10-17 DIAGNOSIS — R079 Chest pain, unspecified: Secondary | ICD-10-CM

## 2016-10-17 LAB — ECHOCARDIOGRAM COMPLETE
E decel time: 275 msec
E/e' ratio: 12.45
FS: 38 % (ref 28–44)
IVS/LV PW RATIO, ED: 1.03
LA ID, A-P, ES: 41 mm
LA diam end sys: 41 mm
LA diam index: 1.9 cm/m2
LA vol A4C: 62.7 ml
LA vol index: 25.1 mL/m2
LA vol: 54 mL
LV E/e' medial: 12.45
LV E/e'average: 12.45
LV PW d: 9.93 mm — AB (ref 0.6–1.1)
LV dias vol index: 32 mL/m2
LV dias vol: 69 mL (ref 46–106)
LV e' LATERAL: 6.74 cm/s
LV sys vol index: 14 mL/m2
LV sys vol: 29 mL (ref 14–42)
LVOT SV: 107 mL
LVOT VTI: 30.9 cm
LVOT area: 3.46 cm2
LVOT diameter: 21 mm
LVOT peak grad rest: 8 mmHg
LVOT peak vel: 138 cm/s
Lateral S' vel: 16.2 cm/s
MV Dec: 275
MV Peak grad: 3 mmHg
MV pk A vel: 119 m/s
MV pk E vel: 83.9 m/s
Simpson's disk: 58
Stroke v: 40 ml
TAPSE: 30.9 mm
TDI e' lateral: 6.74
TDI e' medial: 7.18

## 2016-10-17 NOTE — Progress Notes (Signed)
*  PRELIMINARY RESULTS* Echocardiogram 2D Echocardiogram has been performed.  Stacey DrainWhite, Casie Sturgeon J 10/17/2016, 9:27 AM

## 2016-10-18 ENCOUNTER — Encounter (HOSPITAL_COMMUNITY): Payer: BLUE CROSS/BLUE SHIELD

## 2016-10-18 ENCOUNTER — Encounter (HOSPITAL_COMMUNITY): Payer: Self-pay

## 2016-10-18 ENCOUNTER — Encounter (HOSPITAL_COMMUNITY)
Admission: RE | Admit: 2016-10-18 | Discharge: 2016-10-18 | Disposition: A | Discharge status: Home / Self Care | Payer: BLUE CROSS/BLUE SHIELD | Source: Ambulatory Visit | Attending: Physician Assistant | Admitting: Physician Assistant

## 2016-10-18 DIAGNOSIS — R079 Chest pain, unspecified: Secondary | ICD-10-CM | POA: Insufficient documentation

## 2016-10-18 LAB — NM MYOCAR MULTI W/SPECT W/WALL MOTION / EF
Estimated workload: 10.4 METS
Exercise duration (min): 8 min
Exercise duration (sec): 30 s
LV dias vol: 63 mL (ref 46–106)
LV sys vol: 17 mL
MPHR: 168 {beats}/min
Peak HR: 144 {beats}/min
Percent HR: 85 %
RATE: 0.39
RPE: 16
Rest HR: 79 {beats}/min
SDS: 3
SRS: 0
SSS: 3
TID: 1.13

## 2016-10-18 MED ORDER — TECHNETIUM TC 99M TETROFOSMIN IV KIT
30.0000 | PACK | Freq: Once | INTRAVENOUS | Status: AC | PRN
  Administered 2016-10-18: 29.5 via INTRAVENOUS

## 2016-10-18 MED ORDER — REGADENOSON 0.4 MG/5ML IV SOLN
INTRAVENOUS | Status: AC
  Filled 2016-10-18: qty 5

## 2016-10-18 MED ORDER — SODIUM CHLORIDE 0.9% FLUSH
INTRAVENOUS | Status: AC
  Administered 2016-10-18: 10 mL via INTRAVENOUS
  Filled 2016-10-18: qty 10

## 2016-10-18 MED ORDER — TECHNETIUM TC 99M TETROFOSMIN IV KIT
10.0000 | PACK | Freq: Once | INTRAVENOUS | Status: AC | PRN
  Administered 2016-10-18: 11 via INTRAVENOUS

## 2016-10-24 DIAGNOSIS — Z6838 Body mass index (BMI) 38.0-38.9, adult: Secondary | ICD-10-CM | POA: Diagnosis not present

## 2016-10-24 DIAGNOSIS — I10 Essential (primary) hypertension: Secondary | ICD-10-CM | POA: Diagnosis not present

## 2016-10-24 DIAGNOSIS — Z0000 Encounter for general adult medical examination without abnormal findings: Secondary | ICD-10-CM | POA: Diagnosis not present

## 2016-10-24 DIAGNOSIS — Z23 Encounter for immunization: Secondary | ICD-10-CM | POA: Diagnosis not present

## 2016-10-24 NOTE — Progress Notes (Signed)
Cardiology Office Note    Date:  10/25/2016   ID:  Rebecca Wood, DOB [birth year], MRN [id number]  PCP:  Frederich Chick, MD  Cardiologist:  Dr. Shirlee Latch  Chief Complaint: 2 weeks  follow up of chest pain   History of Present Illness:   Rebecca Wood is a 51 y.o. female with history of prior gastric bypass, HTN, palpitations, GERD, atypical chest pain presents for follow up.   Prior hx of atypical chest pain. F/u outside echo was abnormal.  The report mentions a "hyperechoic density in the left atrium." It was recommended that she have further workup of this. Cardiac MRI was done to evaluate possible LA mass. There was no evidence for mass, aortic insufficiency was mild. Holter monitor for palpitations showed no significant abnormalities.  Father with MI at 62, brother with MI in his 67s.   Seen by me 10/11/16 for chest pressure and dyspnea. 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 Recently elevated BP. Started on ASA 81mg  and advised to increase amlodipine 10mg  qd as advised by PCP.   Exercise Myoview low risk study. EF of 65%. Duke treadmill score of 8 supports low risk for major cardiac events. Echo normal LV EF with grade 1 DD.   Here today for follow-up. Her chest pain has been improved significantly after light exercise and somewhat control stress from work. Denies syncope, palpitation, lower extremity edema, nausea, vomiting, dizziness, orthopnea or PND. Past Medical History:  Diagnosis Date  . Depression   . Hypertension, essential   . Obesity   . PMDD (premenstrual dysphoric disorder)     Past Surgical History:  Procedure Laterality Date  . GASTRIC BYPASS  2006    Current Medications: Prior to Admission medications   Medication Sig Start Date End Date Taking? Authorizing Provider  amLODipine (NORVASC) 10 MG tablet Take 10 mg by mouth daily.    Historical Provider, MD  aspirin EC 81 MG tablet Take 1 tablet (81 mg total) by mouth daily. 10/11/16   Darrious Youman, PA  Calcium-Magnesium-Vitamin D (CALCIUM 500 PO) Take by mouth 3 (three) times daily.    Historical Provider, MD  escitalopram (LEXAPRO) 20 MG tablet Take 20 mg by mouth daily. 08/24/16   Historical Provider, MD  ferrous sulfate 325 (65 FE) MG EC tablet Take 325 mg by mouth daily with breakfast.     Historical Provider, MD  Methylcellulose, Laxative, (CITRUCEL PO) Take by mouth 3 (three) times daily as needed.    Historical Provider, MD  Multiple Vitamin (MULTIVITAMIN) tablet Take 1 tablet by mouth 2 (two) times daily.    Historical Provider, MD  omeprazole (PRILOSEC) 10 MG capsule Take 20 mg by mouth daily.    Historical Provider, MD  Vitamin D, Ergocalciferol, (DRISDOL) 50000 units CAPS capsule Take 50,000 Units by mouth once a week. 08/03/16   Historical Provider, MD    Allergies:   Codeine   Social History   Social History  . Marital status: Married    Spouse name: N/A  . Number of children: N/A  . Years of education: N/A   Social History Main Topics  . Smoking status: Never Smoker  . Smokeless tobacco: Never Used  . Alcohol use Yes     Comment: occasional liquor  . Drug use: No  . Sexual activity: Yes    Birth control/ protection: Surgical     Comment: VAS   Other Topics Concern  . None   Social History Narrative  .  None     Family History:  The patient's family history includes Cancer (age of onset: 54) in her mother; Depression in her brother; Heart attack in her brother; Heart attack (age of onset: 29) in her father.   ROS:   Please see the history of present illness.    ROS All other systems reviewed and are negative.   PHYSICAL EXAM:   VS:  BP 126/80   Pulse 83   Ht 5\' 3"  (1.6 m)   Wt 219 lb 6.4 oz (99.5 kg)   LMP 10/13/2016   BMI 38.86 kg/m    GEN: Well nourished, well developed, in no acute distress  HEENT: normal  Neck: no JVD, carotid bruits, or masses Cardiac: RRR; no murmurs, rubs, or gallops,no edema  Respiratory:  clear to auscultation  bilaterally, normal work of breathing GI: soft, nontender, nondistended, + BS MS: no deformity or atrophy  Skin: warm and dry, no rash Neuro:  Alert and Oriented x 3, Strength and sensation are intact Psych: euthymic mood, full affect  Wt Readings from Last 3 Encounters:  10/25/16 219 lb 6.4 oz (99.5 kg)  10/11/16 219 lb 9.6 oz (99.6 kg)  03/15/14 199 lb (90.3 kg)      Studies/Labs Reviewed:   EKG:  EKG is not ordered today.    Recent Labs: No results found for requested labs within last 8760 hours.   Lipid Panel    Component Value Date/Time   CHOL 168 02/11/2014 0913   TRIG 130 02/11/2014 0913   HDL 75 02/11/2014 0913   CHOLHDL 2.6 12/27/2012 0843   VLDL 24 12/27/2012 0843   LDLCALC 67 02/11/2014 0913    Additional studies/ records that were reviewed today include:   Echocardiogram: 10/17/16 LV EF: 60% -   65%  ------------------------------------------------------------------- Indications:      Chest pain 786.51.  Shortness of breath 786.05.   ------------------------------------------------------------------- History:   PMH:  Palpitations, Obesity.  ------------------------------------------------------------------- Study Conclusions  - Left ventricle: The cavity size was normal. Wall thickness was   normal. Systolic function was normal. The estimated ejection   fraction was in the range of 60% to 65%. Doppler parameters are   consistent with abnormal left ventricular relaxation (grade 1   diastolic dysfunction).   Exercise Myoview 10/18/16 Study Result    Blood pressure demonstrated a normal response to exercise.  There was no ST segment deviation noted during stress.  The study is normal. There are no perfusion defects consistent with prior infarct or current ischemia.  This is a low risk study.  The left ventricular ejection fraction is hyperdynamic (>65%).  Duke treadmill score of 8 supports low risk for major cardiac events.        ASSESSMENT & PLAN:    1. Chest pressure/dyspnea - This has been improved with lifestyle modification. Likely related to disconditioning, stress and anxiety. Advised regular cardiac exercise and heart healthy diet. Find ways to control stress and anxiety. Workup has been normal as described above. Patient willing to change her lifestyle. Discuss heart failure symptoms in detail. Follow-up with primary care provider. Cardiology follow-up as needed. Continue aspirin 81 mg daily.  2. Hypertension - Well control on increased dose of amlodipine.    Medication Adjustments/Labs and Tests Ordered: Current medicines are reviewed at length with the patient today.  Concerns regarding medicines are outlined above.  Medication changes, Labs and Tests ordered today are listed in the Patient Instructions below. Patient Instructions  Your physician recommends that you continue  on your current medications as directed. Please refer to the Current Medication list given to you today.   Your physician recommends that you schedule a follow-up appointment in: AS NEEDED    Lorelei PontSigned, Abby Stines, [place]  10/25/2016 8:56 AM    [facility] [address], [place], [place]  [address] Phone: [phone]; Fax: [phone]

## 2016-10-25 ENCOUNTER — Ambulatory Visit (INDEPENDENT_AMBULATORY_CARE_PROVIDER_SITE_OTHER): Payer: BLUE CROSS/BLUE SHIELD | Admitting: Physician Assistant

## 2016-10-25 ENCOUNTER — Encounter: Payer: Self-pay | Admitting: Physician Assistant

## 2016-10-25 VITALS — BP 126/80 | HR 83 | Ht 63.0 in | Wt 219.4 lb

## 2016-10-25 DIAGNOSIS — I10 Essential (primary) hypertension: Secondary | ICD-10-CM | POA: Diagnosis not present

## 2016-10-25 DIAGNOSIS — Z8249 Family history of ischemic heart disease and other diseases of the circulatory system: Secondary | ICD-10-CM

## 2016-10-25 DIAGNOSIS — R0602 Shortness of breath: Secondary | ICD-10-CM | POA: Diagnosis not present

## 2016-10-25 DIAGNOSIS — R079 Chest pain, unspecified: Secondary | ICD-10-CM | POA: Diagnosis not present

## 2016-10-25 NOTE — Patient Instructions (Addendum)
Your physician recommends that you continue on your current medications as directed. Please refer to the Current Medication list given to you today.    Your physician recommends that you schedule a follow-up appointment in:  AS NEEDED 

## 2016-11-12 ENCOUNTER — Encounter: Payer: Self-pay | Admitting: Vascular Surgery

## 2016-11-19 ENCOUNTER — Encounter: Payer: BLUE CROSS/BLUE SHIELD | Admitting: Vascular Surgery

## 2016-12-11 ENCOUNTER — Encounter: Payer: Self-pay | Admitting: Vascular Surgery

## 2016-12-18 ENCOUNTER — Encounter: Payer: Self-pay | Admitting: Vascular Surgery

## 2016-12-18 ENCOUNTER — Encounter: Payer: BLUE CROSS/BLUE SHIELD | Admitting: Vascular Surgery

## 2016-12-18 ENCOUNTER — Ambulatory Visit (INDEPENDENT_AMBULATORY_CARE_PROVIDER_SITE_OTHER): Payer: BLUE CROSS/BLUE SHIELD | Admitting: Vascular Surgery

## 2016-12-18 VITALS — BP 156/90 | HR 82 | Temp 98.3°F | Resp 18 | Ht 63.0 in | Wt 220.3 lb

## 2016-12-18 DIAGNOSIS — I83893 Varicose veins of bilateral lower extremities with other complications: Secondary | ICD-10-CM | POA: Diagnosis not present

## 2016-12-18 NOTE — Progress Notes (Signed)
Subjective:     Patient ID: Rebecca Wood, female   DOB: [birth year], 51 y.o.   MRN: [id number]  HPI This 51 year old female was referred by Dr. Shirlean Mylararol Webb for evaluation of painful varicosities in both lower extremities right worsen left. She has noticed bulging varicosities for many years but over the past several months she has developed distal edema which is a new finding. She has heaviness as well as aching throbbing and burning discomfort in both legs right worse than left which worsens as the day progresses. She does not were elastic compression stockings nor elevate her legs a regular basis. She has no history of DVT thrombophlebitis stasis ulcers or bleeding. She has never had previous treatment. Her symptoms are worsening and affecting her daily living.  Past Medical History:  Diagnosis Date  . Depression   . Hypertension, essential   . Obesity   . PMDD (premenstrual dysphoric disorder)     Social History  Substance Use Topics  . Smoking status: Never Smoker  . Smokeless tobacco: Never Used  . Alcohol use Yes     Comment: occasional liquor    Family History  Problem Relation Age of Onset  . Cancer Mother 3562    kidney  . Heart attack Father 7365  . Heart disease Father   . Depression Brother   . Heart attack Brother   . Heart disease Brother     Allergies  Allergen Reactions  . Codeine      Current Outpatient Prescriptions:  .  amLODipine (NORVASC) 10 MG tablet, Take 10 mg by mouth daily., Disp: , Rfl:  .  Calcium-Magnesium-Vitamin D (CALCIUM 500 PO), Take by mouth 3 (three) times daily., Disp: , Rfl:  .  escitalopram (LEXAPRO) 20 MG tablet, Take 20 mg by mouth daily., Disp: , Rfl: 1 .  ferrous sulfate 325 (65 FE) MG EC tablet, Take 325 mg by mouth daily with breakfast. , Disp: , Rfl:  .  Multiple Vitamin (MULTIVITAMIN) tablet, Take 1 tablet by mouth 2 (two) times daily., Disp: , Rfl:  .  omeprazole (PRILOSEC) 20 MG capsule, Take 40 mg by mouth daily. , Disp: , Rfl:  .   aspirin EC 81 MG tablet, Take 1 tablet (81 mg total) by mouth daily., Disp: , Rfl:  .  Methylcellulose, Laxative, (CITRUCEL PO), Take by mouth 3 (three) times daily as needed., Disp: , Rfl:  .  Vitamin D, Ergocalciferol, (DRISDOL) 50000 units CAPS capsule, Take 50,000 Units by mouth once a week., Disp: , Rfl: 0  Vitals:   12/18/16 1104 12/18/16 1106  BP: (!) 162/88 (!) 156/90  Pulse: 82   Resp: 18   Temp: 98.3 F (36.8 C)   TempSrc: Oral   SpO2: 95%   Weight: 220 lb 4.8 oz (99.9 kg)   Height: 5\' 3"  (1.6 m)     Body mass index is 39.02 kg/m.         Review of Systems Denies chest pain, dyspnea on exertion, PND, orthopnea, hemoptysis, claudication    Objective:   Physical Exam BP (!) 156/90 (BP Location: Left Arm) Comment: recheck  Pulse 82   Temp 98.3 F (36.8 C) (Oral)   Resp 18   Ht 5\' 3"  (1.6 m)   Wt 220 lb 4.8 oz (99.9 kg)   SpO2 95%   BMI 39.02 kg/m     Gen.-alert and oriented x3 in no apparent distress HEENT normal for age Lungs no rhonchi or wheezing Cardiovascular regular rhythm no murmurs carotid  pulses 3+ palpable no bruits audible Abdomen soft nontender no palpable masses Musculoskeletal free of  major deformities Skin clear -no rashes Neurologic normal Lower extremities 3+ femoral and dorsalis pedis pulses palpable bilaterally with an plus edema bilaterally Right leg with large bulging varicosities beginning at the knee medially over the great saphenous vein extending down towards the medial malleolus. Extensive network of reticular and spider veins in right medial malleolar area. Early hyperpigmentation but no active ulcer. Left leg with bulging varicosities in distal anterior thigh and medial calf below the knee with no hyperpigmentation or ulceration noted.  Today I performed a bedside SonoSite ultrasound exam. There is a very large right great saphenous vein with gross reflux throughout supplying these painful varicosities. There is also an  enlarged left great saphenous vein with gross reflux throughout supplying painful varicosities       Assessment:     Bilateral painful varicosities and swelling due to gross reflux bilateral great saphenous veins right worse than left. This is causing symptoms which are affecting patient's daily living.-CEAP3    Plan:         #1 long leg elastic compression stockings 20-30 mm gradient #2 elevate legs as much as possible #3 ibuprofen daily on a regular basis for pain #4 return in 3 months-She will have formal venous reflux study upon return and I will make formal recommendations at that time She will likely need #1 laser ablation right great saphenous vein with greater than 20 stab phlebectomy followed by 2 units of sclerotherapy #2 possible laser ablation left great saphenous vein with multiple stab phlebectomy Will  return in 3 months for ultrasound

## 2016-12-25 NOTE — Addendum Note (Signed)
Addended by: Burton ApleyPETTY, Kindall Swaby A on: 12/25/2016 12:32 PM   Modules accepted: Orders

## 2017-01-08 ENCOUNTER — Encounter: Payer: BLUE CROSS/BLUE SHIELD | Admitting: Vascular Surgery

## 2017-01-22 DIAGNOSIS — Z6838 Body mass index (BMI) 38.0-38.9, adult: Secondary | ICD-10-CM | POA: Diagnosis not present

## 2017-01-22 DIAGNOSIS — M5432 Sciatica, left side: Secondary | ICD-10-CM | POA: Diagnosis not present

## 2017-01-22 DIAGNOSIS — I10 Essential (primary) hypertension: Secondary | ICD-10-CM | POA: Diagnosis not present

## 2017-01-30 DIAGNOSIS — Z6839 Body mass index (BMI) 39.0-39.9, adult: Secondary | ICD-10-CM | POA: Diagnosis not present

## 2017-01-30 DIAGNOSIS — R87618 Other abnormal cytological findings on specimens from cervix uteri: Secondary | ICD-10-CM | POA: Diagnosis not present

## 2017-01-30 DIAGNOSIS — Z124 Encounter for screening for malignant neoplasm of cervix: Secondary | ICD-10-CM | POA: Diagnosis not present

## 2017-01-30 DIAGNOSIS — Z01419 Encounter for gynecological examination (general) (routine) without abnormal findings: Secondary | ICD-10-CM | POA: Diagnosis not present

## 2017-01-30 DIAGNOSIS — Z1231 Encounter for screening mammogram for malignant neoplasm of breast: Secondary | ICD-10-CM | POA: Diagnosis not present

## 2017-01-30 DIAGNOSIS — Z3040 Encounter for surveillance of contraceptives, unspecified: Secondary | ICD-10-CM | POA: Diagnosis not present

## 2017-01-30 DIAGNOSIS — R1032 Left lower quadrant pain: Secondary | ICD-10-CM | POA: Diagnosis not present

## 2017-03-20 ENCOUNTER — Encounter: Payer: Self-pay | Admitting: Vascular Surgery

## 2017-03-21 DIAGNOSIS — H0011 Chalazion right upper eyelid: Secondary | ICD-10-CM | POA: Diagnosis not present

## 2017-03-26 ENCOUNTER — Encounter: Payer: Self-pay | Admitting: Vascular Surgery

## 2017-03-26 ENCOUNTER — Ambulatory Visit (INDEPENDENT_AMBULATORY_CARE_PROVIDER_SITE_OTHER): Payer: BLUE CROSS/BLUE SHIELD | Admitting: Vascular Surgery

## 2017-03-26 ENCOUNTER — Ambulatory Visit (HOSPITAL_COMMUNITY)
Admission: RE | Admit: 2017-03-26 | Discharge: 2017-03-26 | Disposition: A | Discharge status: Home / Self Care | Payer: BLUE CROSS/BLUE SHIELD | Source: Ambulatory Visit | Attending: Vascular Surgery | Admitting: Vascular Surgery

## 2017-03-26 VITALS — BP 152/76 | HR 79 | Temp 98.0°F | Resp 16 | Ht 63.0 in | Wt 219.0 lb

## 2017-03-26 DIAGNOSIS — I83893 Varicose veins of bilateral lower extremities with other complications: Secondary | ICD-10-CM | POA: Insufficient documentation

## 2017-03-26 NOTE — Progress Notes (Signed)
[facility] [place]  Patient name: Rebecca Wood MRN: [id number] DOB: 1966/02/05 Sex: female  REASON FOR VISIT: Continued discussion of right leg greater than left venous hypertension and complications from venous varicosities  HPI: Rebecca Wood is a 51 y.o. female here today for continued discussion. She has warn her graduated compression garments elevation when possible. Continues to have discomfort mostly in her right leg. She reports achy sensation over the large varicosities and also swelling at the end of the day. Portions she has had no issues of bleeding or skin changes.  Past Medical History:  Diagnosis Date  . Depression   . Hypertension, essential   . Obesity   . PMDD (premenstrual dysphoric disorder)     Family History  Problem Relation Age of Onset  . Cancer Mother [id number]       kidney  . Heart attack Father 6665  . Heart disease Father   . Depression Brother   . Heart attack Brother   . Heart disease Brother     SOCIAL HISTORY: Social History  Substance Use Topics  . Smoking status: Never Smoker  . Smokeless tobacco: Never Used  . Alcohol use Yes     Comment: occasional liquor    Allergies  Allergen Reactions  . Codeine     Current Outpatient Prescriptions  Medication Sig Dispense Refill  . amLODipine (NORVASC) 10 MG tablet Take 10 mg by mouth daily.    Marland Kitchen. aspirin EC 81 MG tablet Take 1 tablet (81 mg total) by mouth daily.    . Calcium-Magnesium-Vitamin D (CALCIUM 500 PO) Take by mouth 3 (three) times daily.    Marland Kitchen. escitalopram (LEXAPRO) 20 MG tablet Take 20 mg by mouth daily.  1  . ferrous sulfate 325 (65 FE) MG EC tablet Take 325 mg by mouth daily with breakfast.     . Methylcellulose, Laxative, (CITRUCEL PO) Take by mouth 3 (three) times daily as needed.    . Multiple Vitamin (MULTIVITAMIN) tablet Take 1 tablet by mouth 2 (two) times daily.    Marland Kitchen. omeprazole (PRILOSEC) 20 MG capsule Take 40 mg by mouth  daily.     . Vitamin D, Ergocalciferol, (DRISDOL) 50000 units CAPS capsule Take 50,000 Units by mouth once a week.  0   No current facility-administered medications for this visit.     REVIEW OF SYSTEMS:  [X]  denotes positive finding, [ ]  denotes negative finding Cardiac  Comments:  Chest pain or chest pressure:    Shortness of breath upon exertion:    Short of breath when lying flat:    Irregular heart rhythm:        Vascular    Pain in calf, thigh, or hip brought on by ambulation:    Pain in feet at night that wakes you up from your sleep:     Blood clot in your veins:    Leg swelling:  x         PHYSICAL EXAM: Vitals:   03/26/17 1004  BP: (!) 152/76  Pulse: 79  Resp: 16  Temp: 98 F (36.7 C)  SpO2: 96%  Weight: 219 lb (99.3 kg)  Height: 5\' 3"  (1.6 m)    GENERAL: The patient is a well-nourished female, in no acute distress. The vital signs are documented above. CARDIOVASCULAR: Palpable dorsalis pedis pulses bilaterally. Large varicosities over the entire medial right calf and smaller varicosities over her left knee PULMONARY: There is good air exchange  MUSCULOSKELETAL: There are no major  deformities or cyanosis. NEUROLOGIC: No focal weakness or paresthesias are detected. SKIN: There are no ulcers or rashes noted. PSYCHIATRIC: The patient has a normal affect.  DATA:  He did undergo formal venous duplex today which showed enlargement and reflux throughout her right great saphenous vein. These extend into the large varicosities in her medial calf. On the left she does have reflux in the proximal segment of her saphenous vein to mid thigh with enlargement at that area.  MEDICAL ISSUES: Failed conservative treatment with enlargement in her saphenous vein with reflux into the large stricture varicosities in her right calf. Also painful telangiectasia at the level of her calf and down onto her ankle. Have recommended laser ablation of her right great saphenous vein and stab  phlebectomy of greater than 20 stabs. Also suspect that she will require 2 units of sclerotherapy following resolution of her laser and stent treatment. She understands and wished to proceed as soon as possible. She is having minimal discomfort with her left leg currently and would recommend continued observation and conservative treatment only.    Larina Earthly, MD FACS [facility] Tel [phone] Pager [phone]

## 2017-03-29 ENCOUNTER — Other Ambulatory Visit: Payer: Self-pay | Admitting: *Deleted

## 2017-03-29 DIAGNOSIS — I83811 Varicose veins of right lower extremities with pain: Secondary | ICD-10-CM

## 2017-04-15 ENCOUNTER — Encounter: Payer: Self-pay | Admitting: Vascular Surgery

## 2017-04-24 ENCOUNTER — Encounter: Payer: Self-pay | Admitting: Vascular Surgery

## 2017-04-25 ENCOUNTER — Encounter: Payer: Self-pay | Admitting: Vascular Surgery

## 2017-04-25 ENCOUNTER — Ambulatory Visit (INDEPENDENT_AMBULATORY_CARE_PROVIDER_SITE_OTHER): Payer: BLUE CROSS/BLUE SHIELD | Admitting: Vascular Surgery

## 2017-04-25 VITALS — BP 145/79 | HR 82 | Temp 97.6°F | Resp 16 | Ht 63.0 in | Wt 210.0 lb

## 2017-04-25 DIAGNOSIS — I83893 Varicose veins of bilateral lower extremities with other complications: Secondary | ICD-10-CM | POA: Diagnosis not present

## 2017-04-25 NOTE — Progress Notes (Signed)
     Laser Ablation Procedure    Date: 04/25/2017   Rebecca Wood DOB:1965-10-23  Consent signed: Yes    Surgeon:  Dr. Tawanna Coolerodd Clearance Chenault  Procedure: Laser Ablation: right Greater Saphenous Vein  BP (!) 145/79   Pulse 82   Temp 97.6 F (36.4 C)   Resp 16   Ht 5\' 3"  (1.6 m)   Wt 210 lb (95.3 kg)   SpO2 99%   BMI 37.20 kg/m   Tumescent Anesthesia: 500 cc 0.9% NaCl with 50 cc Lidocaine HCL with 1% Epi and 15 cc 8.4% NaHCO3  Local Anesthesia: 5 cc Lidocaine HCL and NaHCO3 (ratio 2:1)  15 watts continuous mode        Total energy: 2423   Total time: 2:40    Stab Phlebectomy: >20 Sites: Calf  Patient tolerated procedure well  Notes:   Description of Procedure:  After marking the course of the secondary varicosities, the patient was placed on the operating table in the supine position, and the right leg was prepped and draped in sterile fashion.   Local anesthetic was administered and under ultrasound guidance the saphenous vein was accessed with a micro needle and guide wire; then the mirco puncture sheath was placed.  A guide wire was inserted saphenofemoral junction , followed by a 5 french sheath.  The position of the sheath and then the laser fiber below the junction was confirmed using the ultrasound.  Tumescent anesthesia was administered along the course of the saphenous vein using ultrasound guidance. The patient was placed in Trendelenburg position and protective laser glasses were placed on patient and staff, and the laser was fired at 15 watts continuous mode advancing 1-512mm/second for a total of 2423 joules.   For stab phlebectomies, local anesthetic was administered at the previously marked varicosities, and tumescent anesthesia was administered around the vessels.  Greater than 20 stab wounds were made using the tip of an 11 blade. And using the vein hook, the phlebectomies were performed using a hemostat to avulse the varicosities.  Adequate hemostasis was achieved.      Steri strips were applied to the stab wounds and ABD pads and thigh high compression stockings were applied.  Ace wrap bandages were applied over the phlebectomy sites and at the top of the saphenofemoral junction. Blood loss was less than 15 cc.  The patient ambulated out of the operating room having tolerated the procedure well.

## 2017-05-02 ENCOUNTER — Encounter: Payer: Self-pay | Admitting: Vascular Surgery

## 2017-05-02 ENCOUNTER — Ambulatory Visit (INDEPENDENT_AMBULATORY_CARE_PROVIDER_SITE_OTHER): Payer: Self-pay | Admitting: Vascular Surgery

## 2017-05-02 ENCOUNTER — Ambulatory Visit (HOSPITAL_COMMUNITY)
Admission: RE | Admit: 2017-05-02 | Discharge: 2017-05-02 | Disposition: A | Discharge status: Home / Self Care | Payer: BLUE CROSS/BLUE SHIELD | Source: Ambulatory Visit | Attending: Vascular Surgery | Admitting: Vascular Surgery

## 2017-05-02 VITALS — BP 122/73 | HR 81 | Temp 97.3°F | Resp 16 | Ht 63.0 in | Wt 210.0 lb

## 2017-05-02 DIAGNOSIS — I83811 Varicose veins of right lower extremities with pain: Secondary | ICD-10-CM

## 2017-05-02 DIAGNOSIS — I83893 Varicose veins of bilateral lower extremities with other complications: Secondary | ICD-10-CM

## 2017-05-02 NOTE — Progress Notes (Signed)
   Patient name: Rebecca Wood MRN: [id number] DOB: 06-02-1966 Sex: female  REASON FOR VISIT: One-week follow-up after right great saphenous vein ablation and multiple tributary phlebectomy is  HPI: Rebecca Wood is a 51 y.o. female here today for one-week follow-up. She did have more than the usual discomfort in her medial thigh from the laser ablation. Minimal discomfort from the phlebectomy throughout her distal thigh and calf. She does have the typical bruising in this is resolving. Has been compliant with her compression garments.  Current Outpatient Prescriptions  Medication Sig Dispense Refill  . amLODipine (NORVASC) 10 MG tablet Take 10 mg by mouth daily.    Marland Kitchen. aspirin EC 81 MG tablet Take 1 tablet (81 mg total) by mouth daily.    . Calcium Carbonate-Vitamin D (CALCIUM 500/D PO) Calcium 500  takes 3 a day    . Calcium-Magnesium-Vitamin D (CALCIUM 500 PO) Take by mouth 3 (three) times daily.    Marland Kitchen. escitalopram (LEXAPRO) 20 MG tablet Take 20 mg by mouth daily.  1  . ferrous sulfate 325 (65 FE) MG EC tablet Take 325 mg by mouth daily with breakfast.     . Methylcellulose, Laxative, (CITRUCEL PO) Take by mouth 3 (three) times daily as needed.    . Multiple Vitamin (MULTIVITAMIN) tablet Take 1 tablet by mouth 2 (two) times daily.    Marland Kitchen. omeprazole (PRILOSEC) 20 MG capsule Take 40 mg by mouth daily.     . Pseudoephedrine-Ibuprofen 30-200 MG TABS Ibuprofen Cold-Sinus (with pseudoephedrine) 30 mg-200 mg tablet  Take 1 tablet every 4 hours by oral route.    . Vitamin D, Ergocalciferol, (DRISDOL) 50000 units CAPS capsule Take 50,000 Units by mouth once a week.  0   No current facility-administered medications for this visit.      PHYSICAL EXAM: Vitals:   05/02/17 0937  BP: 122/73  Pulse: 81  Resp: 16  Temp: (!) 97.3 F (36.3 C)  SpO2: 97%  Weight: 210 lb (95.3 kg)  Height: 5\' 3"  (1.6 m)    GENERAL: The patient is a well-nourished female, in no acute  distress. The vital signs are documented above. Some thickening over the phlebectomy sites were there is blood under the skin and this was discussed with the patient. Bruising in her distal thigh.  Duplex today shows closure of her saphenous vein from the knee to 0.9 cm below the saphenofemoral junction and no evidence of DVT  MEDICAL ISSUES: Excellent Vic Esco results from right leg laser ablation and stab phlebectomy. She does have reflux in her left great saphenous vein and tributary varicosities are not quite as extensive as she had on the right. She does report discomfort with the achy sensation with prolonged standing and some swelling. He does have some pain specifically over the varicosities as well. She wishes to proceed with left leg ablation and phlebectomy, 10-20 in the near future.   Larina Earthlyodd F. Chrisoula Zegarra, MD FACS [facility] Tel [phone] Pager [phone]

## 2017-05-07 ENCOUNTER — Other Ambulatory Visit: Payer: Self-pay | Admitting: *Deleted

## 2017-05-07 DIAGNOSIS — I83812 Varicose veins of left lower extremities with pain: Secondary | ICD-10-CM

## 2017-05-27 DIAGNOSIS — F411 Generalized anxiety disorder: Secondary | ICD-10-CM | POA: Diagnosis not present

## 2017-06-27 ENCOUNTER — Other Ambulatory Visit: Payer: BLUE CROSS/BLUE SHIELD | Admitting: Vascular Surgery

## 2017-07-04 ENCOUNTER — Ambulatory Visit: Payer: BLUE CROSS/BLUE SHIELD | Admitting: Vascular Surgery

## 2017-07-04 ENCOUNTER — Encounter (HOSPITAL_COMMUNITY): Payer: BLUE CROSS/BLUE SHIELD

## 2017-07-04 DIAGNOSIS — R1310 Dysphagia, unspecified: Secondary | ICD-10-CM | POA: Diagnosis not present

## 2017-07-04 DIAGNOSIS — R194 Change in bowel habit: Secondary | ICD-10-CM | POA: Diagnosis not present

## 2017-07-04 DIAGNOSIS — R079 Chest pain, unspecified: Secondary | ICD-10-CM | POA: Diagnosis not present

## 2017-07-04 DIAGNOSIS — K219 Gastro-esophageal reflux disease without esophagitis: Secondary | ICD-10-CM | POA: Diagnosis not present

## 2017-07-18 ENCOUNTER — Other Ambulatory Visit: Payer: BLUE CROSS/BLUE SHIELD | Admitting: Vascular Surgery

## 2017-07-24 ENCOUNTER — Other Ambulatory Visit: Payer: Self-pay | Admitting: Gastroenterology

## 2017-07-24 DIAGNOSIS — K228 Other specified diseases of esophagus: Secondary | ICD-10-CM | POA: Diagnosis not present

## 2017-07-24 DIAGNOSIS — R1310 Dysphagia, unspecified: Secondary | ICD-10-CM | POA: Diagnosis not present

## 2017-07-24 DIAGNOSIS — R079 Chest pain, unspecified: Secondary | ICD-10-CM | POA: Diagnosis not present

## 2017-07-24 DIAGNOSIS — K219 Gastro-esophageal reflux disease without esophagitis: Secondary | ICD-10-CM | POA: Diagnosis not present

## 2017-07-24 NOTE — Progress Notes (Signed)
Rebecca Wyffels MD 

## 2017-07-25 ENCOUNTER — Other Ambulatory Visit: Payer: BLUE CROSS/BLUE SHIELD | Admitting: Vascular Surgery

## 2017-07-25 ENCOUNTER — Encounter: Payer: Self-pay | Admitting: Vascular Surgery

## 2017-07-25 ENCOUNTER — Ambulatory Visit: Payer: BLUE CROSS/BLUE SHIELD | Admitting: Vascular Surgery

## 2017-07-25 ENCOUNTER — Encounter (HOSPITAL_COMMUNITY): Payer: BLUE CROSS/BLUE SHIELD

## 2017-07-25 ENCOUNTER — Ambulatory Visit (INDEPENDENT_AMBULATORY_CARE_PROVIDER_SITE_OTHER): Payer: BLUE CROSS/BLUE SHIELD | Admitting: Vascular Surgery

## 2017-07-25 VITALS — BP 155/82 | HR 86 | Temp 97.8°F | Resp 16 | Ht 63.0 in | Wt 210.0 lb

## 2017-07-25 DIAGNOSIS — I83893 Varicose veins of bilateral lower extremities with other complications: Secondary | ICD-10-CM | POA: Diagnosis not present

## 2017-07-25 DIAGNOSIS — I868 Varicose veins of other specified sites: Secondary | ICD-10-CM

## 2017-07-25 NOTE — Progress Notes (Signed)
     Laser Ablation Procedure    Date: 07/25/2017   Alexandria Lodge DOB:1966-03-17  Consent signed: Yes    Surgeon:  Dr. Tawanna Cooler Termaine Roupp  Procedure: Laser Ablation: left Greater Saphenous Vein  BP (!) 155/82   Pulse 86   Temp 97.8 F (36.6 C)   Resp 16   Ht  (1.6 m)   Wt 210 lb (95.3 kg)   SpO2 96%   BMI 37.20 kg/m   Tumescent Anesthesia: 500 cc 0.9% NaCl with 50 cc Lidocaine HCL with 1% Epi and 15 cc 8.4% NaHCO3  Local Anesthesia: 4 cc Lidocaine HCL and NaHCO3 (ratio 2:1)  15 watts continuous mode        Total energy: 2295   Total time: 2:32    Stab Phlebectomy: 10-20 Sites: Thigh and Calf  Patient tolerated procedure well  Notes:   Description of Procedure:  After marking the course of the secondary varicosities, the patient was placed on the operating table in the supine position, and the left leg was prepped and draped in sterile fashion.   Local anesthetic was administered and under ultrasound guidance the saphenous vein was accessed with a micro needle and guide wire; then the mirco puncture sheath was placed.  A guide wire was inserted saphenofemoral junction , followed by a 5 french sheath.  The position of the sheath and then the laser fiber below the junction was confirmed using the ultrasound.  Tumescent anesthesia was administered along the course of the saphenous vein using ultrasound guidance. The patient was placed in Trendelenburg position and protective laser glasses were placed on patient and staff, and the laser was fired at 15 watts continuous mode advancing 1-17mm/second for a total of 2295 joules.   For stab phlebectomies, local anesthetic was administered at the previously marked varicosities, and tumescent anesthesia was administered around the vessels.  Ten to 20 stab wounds were made using the tip of an 11 blade. And using the vein hook, the phlebectomies were performed using a hemostat to avulse the varicosities.  Adequate hemostasis was achieved.      Steri strips were applied to the stab wounds and ABD pads and thigh high compression stockings were applied.  Ace wrap bandages were applied over the phlebectomy sites and at the top of the saphenofemoral junction. Blood loss was less than 15 cc.  The patient ambulated out of the operating room having tolerated the procedure well.  Uneventful ablation from mid calf to saphenofemoral junction. Multiple stab phlebectomy is over anterior thigh lateral calf and posterior calf

## 2017-07-29 DIAGNOSIS — F411 Generalized anxiety disorder: Secondary | ICD-10-CM | POA: Diagnosis not present

## 2017-07-31 ENCOUNTER — Encounter: Payer: Self-pay | Admitting: Vascular Surgery

## 2017-07-31 ENCOUNTER — Encounter (HOSPITAL_COMMUNITY): Payer: BLUE CROSS/BLUE SHIELD

## 2017-07-31 ENCOUNTER — Ambulatory Visit
Admission: RE | Admit: 2017-07-31 | Discharge: 2017-07-31 | Disposition: A | Discharge status: Home / Self Care | Payer: BLUE CROSS/BLUE SHIELD | Source: Ambulatory Visit | Attending: Gastroenterology | Admitting: Gastroenterology

## 2017-07-31 DIAGNOSIS — R1310 Dysphagia, unspecified: Secondary | ICD-10-CM

## 2017-07-31 DIAGNOSIS — K210 Gastro-esophageal reflux disease with esophagitis: Secondary | ICD-10-CM | POA: Diagnosis not present

## 2017-08-01 ENCOUNTER — Encounter: Payer: Self-pay | Admitting: Vascular Surgery

## 2017-08-01 ENCOUNTER — Ambulatory Visit (INDEPENDENT_AMBULATORY_CARE_PROVIDER_SITE_OTHER): Payer: BLUE CROSS/BLUE SHIELD | Admitting: Vascular Surgery

## 2017-08-01 ENCOUNTER — Ambulatory Visit (HOSPITAL_COMMUNITY)
Admission: RE | Admit: 2017-08-01 | Discharge: 2017-08-01 | Disposition: A | Discharge status: Home / Self Care | Payer: BLUE CROSS/BLUE SHIELD | Source: Ambulatory Visit | Attending: Vascular Surgery | Admitting: Vascular Surgery

## 2017-08-01 VITALS — BP 128/76 | HR 82 | Temp 97.4°F | Resp 16

## 2017-08-01 DIAGNOSIS — Z9889 Other specified postprocedural states: Secondary | ICD-10-CM | POA: Diagnosis not present

## 2017-08-01 DIAGNOSIS — I83812 Varicose veins of left lower extremities with pain: Secondary | ICD-10-CM | POA: Insufficient documentation

## 2017-08-01 DIAGNOSIS — I83893 Varicose veins of bilateral lower extremities with other complications: Secondary | ICD-10-CM

## 2017-08-01 DIAGNOSIS — Z48812 Encounter for surgical aftercare following surgery on the circulatory system: Secondary | ICD-10-CM | POA: Diagnosis not present

## 2017-08-01 NOTE — Progress Notes (Signed)
   Patient name: Rebecca Wood MRN: [id number] DOB: [birth year] Sex: female  REASON FOR VISIT: One week follow-up after left great saphenous vein ablation and 10-20 stab phlebectomy  HPI: Rebecca LodgeRobin Biever is a 51 y.o. female here today for follow-up.  She did very well and has been compliant with her compression garments.  She had her other leg done several months ago and had a great deal of bruising at that time and more than the typical discomfort in her thigh.  She has had minimal bruising and much less discomfort more typical response on this legs treatment.  Current Outpatient Prescriptions  Medication Sig Dispense Refill  . amLODipine (NORVASC) 10 MG tablet Take 10 mg by mouth daily.    Marland Kitchen. aspirin EC 81 MG tablet Take 1 tablet (81 mg total) by mouth daily.    . Calcium Carbonate-Vitamin D (CALCIUM 500/D PO) Calcium 500  takes 3 a day    . Calcium-Magnesium-Vitamin D (CALCIUM 500 PO) Take by mouth 3 (three) times daily.    Marland Kitchen. escitalopram (LEXAPRO) 20 MG tablet Take 20 mg by mouth daily.  1  . ferrous sulfate 325 (65 FE) MG EC tablet Take 325 mg by mouth daily with breakfast.     . Methylcellulose, Laxative, (CITRUCEL PO) Take by mouth 3 (three) times daily as needed.    . Multiple Vitamin (MULTIVITAMIN) tablet Take 1 tablet by mouth 2 (two) times daily.    Marland Kitchen. omeprazole (PRILOSEC) 20 MG capsule Take 40 mg by mouth daily.     . Pseudoephedrine-Ibuprofen 30-200 MG TABS Ibuprofen Cold-Sinus (with pseudoephedrine) 30 mg-200 mg tablet  Take 1 tablet every 4 hours by oral route.    . Vitamin D, Ergocalciferol, (DRISDOL) 50000 units CAPS capsule Take 50,000 Units by mouth once a week.  0  . vortioxetine HBr (TRINTELLIX) 10 MG TABS Take 10 mg by mouth 1 day or 1 dose.     No current facility-administered medications for this visit.      PHYSICAL EXAM: Vitals:   08/01/17 0939  BP: 128/76  Pulse: 82  Resp: 16  Temp: (!) 97.4 F (36.3 C)    GENERAL: The patient is  a well-nourished female, in no acute distress. The vital signs are documented above. Bruising on the medial thigh.  Excellent result from her stab phlebectomies.  I reviewed the duplex with her.  This shows closure of her left great saphenous vein from the proximal calf to 1 cm from the saphenofemoral junction and no evidence of DVT  MEDICAL ISSUES: Stable follow-up in 1 week.  We will continue her compression garments for 1 additional week.  She does have some scattered painful telangiectasia and will return in approximately 1 month for a sclerotherapy treatment of these.  He will notify us if she develops any difficulty in the interim   Larina Earthlyodd F. Kobee Medlen, MD FACS [facility] Tel [phone] Pager [phone]

## 2017-08-02 DIAGNOSIS — Z6838 Body mass index (BMI) 38.0-38.9, adult: Secondary | ICD-10-CM | POA: Diagnosis not present

## 2017-08-02 DIAGNOSIS — F330 Major depressive disorder, recurrent, mild: Secondary | ICD-10-CM | POA: Diagnosis not present

## 2017-08-02 DIAGNOSIS — K219 Gastro-esophageal reflux disease without esophagitis: Secondary | ICD-10-CM | POA: Diagnosis not present

## 2017-08-02 DIAGNOSIS — I10 Essential (primary) hypertension: Secondary | ICD-10-CM | POA: Diagnosis not present

## 2017-08-27 DIAGNOSIS — R079 Chest pain, unspecified: Secondary | ICD-10-CM | POA: Diagnosis not present

## 2017-08-27 DIAGNOSIS — R1310 Dysphagia, unspecified: Secondary | ICD-10-CM | POA: Diagnosis not present

## 2017-08-27 DIAGNOSIS — K210 Gastro-esophageal reflux disease with esophagitis: Secondary | ICD-10-CM | POA: Diagnosis not present

## 2017-09-13 ENCOUNTER — Ambulatory Visit: Payer: BLUE CROSS/BLUE SHIELD | Admitting: *Deleted

## 2017-09-13 DIAGNOSIS — I83893 Varicose veins of bilateral lower extremities with other complications: Secondary | ICD-10-CM | POA: Diagnosis not present

## 2017-09-13 DIAGNOSIS — H04123 Dry eye syndrome of bilateral lacrimal glands: Secondary | ICD-10-CM | POA: Diagnosis not present

## 2017-09-13 DIAGNOSIS — H2513 Age-related nuclear cataract, bilateral: Secondary | ICD-10-CM | POA: Diagnosis not present

## 2017-09-13 NOTE — Progress Notes (Signed)
X=.3% Sotradecol administered with a 27g butterfly.  Patient received a total of 12cc.  Treated a combination of reticulars and spiders. Easy access. Tol well. Anticipate good results. Follow prn. Compression stockings applied.   Compression stockings applied: yes

## 2017-09-16 ENCOUNTER — Ambulatory Visit: Payer: BLUE CROSS/BLUE SHIELD | Admitting: *Deleted

## 2017-09-18 ENCOUNTER — Encounter: Payer: Self-pay | Admitting: *Deleted

## 2017-09-18 DIAGNOSIS — F411 Generalized anxiety disorder: Secondary | ICD-10-CM | POA: Diagnosis not present

## 2017-10-10 DIAGNOSIS — K219 Gastro-esophageal reflux disease without esophagitis: Secondary | ICD-10-CM | POA: Diagnosis not present

## 2017-10-10 DIAGNOSIS — R740 Nonspecific elevation of levels of transaminase and lactic acid dehydrogenase [LDH]: Secondary | ICD-10-CM | POA: Diagnosis not present

## 2017-11-01 DIAGNOSIS — Z6837 Body mass index (BMI) 37.0-37.9, adult: Secondary | ICD-10-CM | POA: Diagnosis not present

## 2017-11-01 DIAGNOSIS — K219 Gastro-esophageal reflux disease without esophagitis: Secondary | ICD-10-CM | POA: Diagnosis not present

## 2017-11-01 DIAGNOSIS — I10 Essential (primary) hypertension: Secondary | ICD-10-CM | POA: Diagnosis not present

## 2017-11-01 DIAGNOSIS — Z0000 Encounter for general adult medical examination without abnormal findings: Secondary | ICD-10-CM | POA: Diagnosis not present

## 2017-11-01 LAB — BASIC METABOLIC PANEL
BUN: 7 (ref 4–21)
CO2: 26 — AB (ref 13–22)
Chloride: 102 (ref 99–108)
Creatinine: 0.6 (ref 0.5–1.1)
Glucose: 89
Potassium: 4.5 (ref 3.4–5.3)
Sodium: 136 — AB (ref 137–147)

## 2017-11-01 LAB — COMPREHENSIVE METABOLIC PANEL
Albumin: 4 (ref 3.5–5.0)
Calcium: 8.7 (ref 8.7–10.7)
GFR calc Af Amer: 131
GFR calc non Af Amer: 108

## 2017-11-01 LAB — HEPATIC FUNCTION PANEL
ALT: 34 (ref 7–35)
AST: 32 (ref 13–35)
Alkaline Phosphatase: 83 (ref 25–125)
Bilirubin, Total: 0.9

## 2018-02-13 DIAGNOSIS — Z01419 Encounter for gynecological examination (general) (routine) without abnormal findings: Secondary | ICD-10-CM | POA: Diagnosis not present

## 2018-02-13 DIAGNOSIS — Z1231 Encounter for screening mammogram for malignant neoplasm of breast: Secondary | ICD-10-CM | POA: Diagnosis not present

## 2018-02-13 DIAGNOSIS — Z6837 Body mass index (BMI) 37.0-37.9, adult: Secondary | ICD-10-CM | POA: Diagnosis not present

## 2018-02-13 DIAGNOSIS — Z3040 Encounter for surveillance of contraceptives, unspecified: Secondary | ICD-10-CM | POA: Diagnosis not present

## 2018-06-10 IMAGING — NM NM MYOCAR MULTI W/SPECT W/WALL MOTION & EF
2 series · 12 of 12 positions shown · non-contrast
Comparison: none

[Series 1: rest · 8.28mm/px · 6 of 64 frames shown]
[frame 6/64]
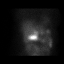
[frame 16/64]
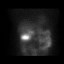
[frame 27/64]
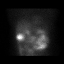
[frame 38/64]
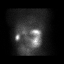
[frame 48/64]
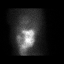
[frame 59/64]
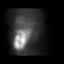

[Series 2: stress gated · 8.28mm/px · 6 of 64 frames shown]
[frame 6/64]
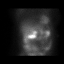
[frame 16/64]
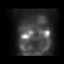
[frame 27/64]
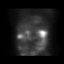
[frame 38/64]
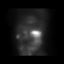
[frame 48/64]
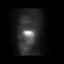
[frame 59/64]
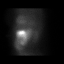

[12 of 12 positions shown; findings below may reference images not displayed]

Canned report from images found in remote index.

Refer to host system for actual result text.

## 2018-07-16 DIAGNOSIS — F329 Major depressive disorder, single episode, unspecified: Secondary | ICD-10-CM | POA: Diagnosis not present

## 2018-07-16 DIAGNOSIS — F411 Generalized anxiety disorder: Secondary | ICD-10-CM | POA: Diagnosis not present

## 2018-07-17 ENCOUNTER — Encounter (HOSPITAL_COMMUNITY): Payer: Self-pay | Admitting: Family Medicine

## 2018-07-17 ENCOUNTER — Ambulatory Visit (HOSPITAL_COMMUNITY)
Admission: EM | Admit: 2018-07-17 | Discharge: 2018-07-17 | Disposition: A | Discharge status: Home / Self Care | Payer: BLUE CROSS/BLUE SHIELD | Attending: Family Medicine | Admitting: Family Medicine

## 2018-07-17 DIAGNOSIS — M79602 Pain in left arm: Secondary | ICD-10-CM

## 2018-07-17 MED ORDER — DICLOFENAC SODIUM 75 MG PO TBEC: 75.0000 mg | DELAYED_RELEASE_TABLET | Freq: Two times a day (BID) | ORAL | 0 refills | Status: DC

## 2018-07-17 NOTE — ED Triage Notes (Signed)
Pt sts left arm pain x several weeks with some palpitations recently

## 2018-07-17 NOTE — ED Provider Notes (Signed)
[facility]    CSN: [id number] Arrival date & time: 07/17/18  1312     History   Chief Complaint Chief Complaint  Patient presents with  . Arm Pain    HPI Rebecca Wood is a 52 y.o. female.   Pt notes left arm pain x several weeks with some palpitations.  Patient states that the left arm pain is intermittent and feels like a dull ache from her shoulder to her elbow.  There is nothing that precipitates the pain that she can identify.  There is no tenderness in the shoulder or arm.  Patient denies nausea, vomiting, shortness of breath (patient thinks that she might of been breathing a little more deeply recently), diaphoresis, substernal chest pain, neck pain, or history of heart disease.  Patient does have a family history of heart disease with her brother and father having had cardiac history at her age.  Patient has no recent history of trauma or unusual activity using her left arm.  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  Patient has had laser surgery for varicose veins in her legs which has been successful.  She says that sometimes she has some edema at the end of the day in her legs but this is not changed recently and she has no calf tenderness or thigh tenderness.     Past Medical History:  Diagnosis Date  . Depression   . Hypertension, essential   . Obesity   . PMDD (premenstrual dysphoric disorder)     Patient Active Problem List   Diagnosis Date Noted  . Varicose veins of bilateral lower extremities with other complications 12/18/2016  . Shortness of breath 10/11/2016  . Palpitations 02/08/2014  . Cardiac mass 02/08/2014  . Obesity   . PMDD (premenstrual dysphoric disorder)   . Chest pain 12/28/2010    Past Surgical History:  Procedure Laterality Date  . GASTRIC BYPASS  2006    OB History    Gravida  0   Para      Term      Preterm      AB      Living        SAB      TAB      Ectopic      Multiple      Live Births                 Home Medications    Prior to Admission medications   Medication Sig Start Date End Date Taking? Authorizing Provider  amLODipine (NORVASC) 10 MG tablet Take 10 mg by mouth daily.    [provider]  aspirin EC 81 MG tablet Take 1 tablet (81 mg total) by mouth daily. 10/11/16   Bhagat, Sharrell Ku, PA  Calcium Carbonate-Vitamin D (CALCIUM 500/D PO) Calcium 500  takes 3 a day    [provider]  Calcium-Magnesium-Vitamin D (CALCIUM 500 PO) Take by mouth 3 (three) times daily.    [provider]  diclofenac (VOLTAREN) 75 MG EC tablet Take 1 tablet (75 mg total) by mouth 2 (two) times daily. 07/17/18   Elvina Sidle, MD  escitalopram (LEXAPRO) 20 MG tablet Take 20 mg by mouth daily. 08/24/16   [provider]  ferrous sulfate 325 (65 FE) MG EC tablet Take 325 mg by mouth daily with breakfast.     [provider]  Methylcellulose, Laxative, (CITRUCEL PO) Take by mouth 3 (three) times daily as needed.  [provider]  Multiple Vitamin (MULTIVITAMIN) tablet Take 1 tablet by mouth 2 (two) times daily.    [provider]  omeprazole (PRILOSEC) 20 MG capsule Take 40 mg by mouth daily.     [provider]  Pseudoephedrine-Ibuprofen 30-200 MG TABS Ibuprofen Cold-Sinus (with pseudoephedrine) 30 mg-200 mg tablet  Take 1 tablet every 4 hours by oral route.    [provider]  Vitamin D, Ergocalciferol, (DRISDOL) 50000 units CAPS capsule Take 50,000 Units by mouth once a week. 08/03/16   [provider]  vortioxetine HBr (TRINTELLIX) 10 MG TABS Take 10 mg by mouth 1 day or 1 dose.    [provider]    Family History Family History  Problem Relation Age of Onset  . Cancer Mother 29       kidney  . Heart attack Father 62  . Heart disease Father   . Depression Brother   . Heart attack Brother   . Heart disease Brother     Social History Social History   Tobacco Use  .  Smoking status: Never Smoker  . Smokeless tobacco: Never Used  Substance Use Topics  . Alcohol use: Yes    Comment: occasional liquor  . Drug use: No     Allergies   Codeine   Review of Systems Review of Systems   Physical Exam Triage Vital Signs ED Triage Vitals [07/17/18 1325]  Enc Vitals Group     BP (!) 156/96     Pulse Rate 92     Resp 18     Temp 97.9 F (36.6 C)     Temp Source Oral     SpO2 98 %     Weight      Height      Head Circumference      Peak Flow      Pain Score      Pain Loc      Pain Edu?      Excl. in GC?    No data found.  Updated Vital Signs BP (!) 156/96 (BP Location: Left Arm)   Pulse 92   Temp 97.9 F (36.6 C) (Oral)   Resp 18   SpO2 98%    Physical Exam  Constitutional: She is oriented to person, place, and time. She appears well-developed and well-nourished.  HENT:  Head: Normocephalic.  Right Ear: External ear normal.  Left Ear: External ear normal.  Mouth/Throat: Oropharynx is clear and moist.  Eyes: Pupils are equal, round, and reactive to light. Conjunctivae and EOM are normal.  Neck: Normal range of motion. Neck supple.  Cardiovascular: Normal rate, regular rhythm and normal heart sounds.  Pulmonary/Chest: Effort normal and breath sounds normal.  Musculoskeletal: Normal range of motion.  Resisted abduction of the left arm result in the same pain patient has been experiencing these past couple weeks.  Lymphadenopathy:    She has no cervical adenopathy.  Neurological: She is alert and oriented to person, place, and time.  Skin: Skin is warm and dry.  Psychiatric: She has a normal mood and affect.  Nursing note and vitals reviewed.    UC Treatments / Results  Labs (all labs ordered are listed, but only abnormal results are displayed) Labs Reviewed - No data to display  EKG None  Radiology No results found.  Procedures Procedures (including critical care time)  Medications Ordered in UC Medications - No  data to display  Initial Impression / Assessment and Plan / UC Course  I have reviewed the triage vital signs and the nursing notes.  Pertinent labs & imaging results that were available during my care of the patient were reviewed by me and considered in my medical decision making (see chart for details).    Final Clinical Impressions(s) / UC Diagnoses   Final diagnoses:  Left arm pain     Discharge Instructions     Your cardiogram has not changed.  The fact that your pain is somewhat reproduced by resisted muscle contraction on the left arm strongly suggest that this may be a rotator cuff irritation.  I believe the medicine prescribed will begin to lessen and eliminate the pain over the next 24 to 48 hours.  Everything points to a muscular etiology at today's examination.  If you develop new symptoms such as increasing shortness of breath, nausea, vomiting, substernal chest pain, or neck pain, please return here or go to the emergency department.    ED Prescriptions    Medication Sig Dispense Auth. Provider   diclofenac (VOLTAREN) 75 MG EC tablet Take 1 tablet (75 mg total) by mouth 2 (two) times daily. 14 tablet Elvina Sidle, MD     Controlled Substance Prescriptions [place] Controlled Substance Registry consulted? Not Applicable   Elvina Sidle, MD 07/17/18 1355

## 2018-07-17 NOTE — Discharge Instructions (Addendum)
Your cardiogram has not changed.  The fact that your pain is somewhat reproduced by resisted muscle contraction on the left arm strongly suggest that this may be a rotator cuff irritation.  I believe the medicine prescribed will begin to lessen and eliminate the pain over the next 24 to 48 hours.  Everything points to a muscular etiology at today's examination.  If you develop new symptoms such as increasing shortness of breath, nausea, vomiting, substernal chest pain, or neck pain, please return here or go to the emergency department.

## 2018-10-30 DIAGNOSIS — D2262 Melanocytic nevi of left upper limb, including shoulder: Secondary | ICD-10-CM | POA: Diagnosis not present

## 2018-10-30 DIAGNOSIS — L738 Other specified follicular disorders: Secondary | ICD-10-CM | POA: Diagnosis not present

## 2018-10-30 DIAGNOSIS — D225 Melanocytic nevi of trunk: Secondary | ICD-10-CM | POA: Diagnosis not present

## 2018-10-30 DIAGNOSIS — L918 Other hypertrophic disorders of the skin: Secondary | ICD-10-CM | POA: Diagnosis not present

## 2018-11-26 DIAGNOSIS — E611 Iron deficiency: Secondary | ICD-10-CM | POA: Diagnosis not present

## 2018-11-26 DIAGNOSIS — R5382 Chronic fatigue, unspecified: Secondary | ICD-10-CM | POA: Diagnosis not present

## 2018-11-26 DIAGNOSIS — Z0000 Encounter for general adult medical examination without abnormal findings: Secondary | ICD-10-CM | POA: Diagnosis not present

## 2018-11-26 DIAGNOSIS — N951 Menopausal and female climacteric states: Secondary | ICD-10-CM | POA: Diagnosis not present

## 2018-11-26 DIAGNOSIS — I10 Essential (primary) hypertension: Secondary | ICD-10-CM | POA: Diagnosis not present

## 2018-11-26 LAB — HEPATIC FUNCTION PANEL
ALT: 49 — AB (ref 7–35)
AST: 42 — AB (ref 13–35)
Alkaline Phosphatase: 85 (ref 25–125)
Bilirubin, Total: 0.8

## 2018-11-26 LAB — COMPREHENSIVE METABOLIC PANEL
Albumin: 4.4 (ref 3.5–5.0)
Calcium: 9 (ref 8.7–10.7)
GFR calc Af Amer: 111
GFR calc non Af Amer: 91

## 2018-11-26 LAB — BASIC METABOLIC PANEL
BUN: 15 (ref 4–21)
CO2: 30 — AB (ref 13–22)
Chloride: 101 (ref 99–108)
Creatinine: 0.7 (ref 0.5–1.1)
Glucose: 99
Potassium: 4.8 (ref 3.4–5.3)
Sodium: 142 (ref 137–147)

## 2018-11-28 ENCOUNTER — Other Ambulatory Visit: Payer: Self-pay | Admitting: Family Medicine

## 2018-11-28 DIAGNOSIS — E041 Nontoxic single thyroid nodule: Secondary | ICD-10-CM

## 2018-12-03 DIAGNOSIS — F411 Generalized anxiety disorder: Secondary | ICD-10-CM | POA: Diagnosis not present

## 2018-12-03 DIAGNOSIS — F329 Major depressive disorder, single episode, unspecified: Secondary | ICD-10-CM | POA: Diagnosis not present

## 2019-03-05 DIAGNOSIS — F411 Generalized anxiety disorder: Secondary | ICD-10-CM | POA: Diagnosis not present

## 2019-03-05 DIAGNOSIS — F329 Major depressive disorder, single episode, unspecified: Secondary | ICD-10-CM | POA: Diagnosis not present

## 2019-03-23 DIAGNOSIS — Z1211 Encounter for screening for malignant neoplasm of colon: Secondary | ICD-10-CM | POA: Diagnosis not present

## 2019-03-23 DIAGNOSIS — Z3040 Encounter for surveillance of contraceptives, unspecified: Secondary | ICD-10-CM | POA: Diagnosis not present

## 2019-03-23 DIAGNOSIS — Z01419 Encounter for gynecological examination (general) (routine) without abnormal findings: Secondary | ICD-10-CM | POA: Diagnosis not present

## 2019-03-23 DIAGNOSIS — Z1231 Encounter for screening mammogram for malignant neoplasm of breast: Secondary | ICD-10-CM | POA: Diagnosis not present

## 2019-04-03 DIAGNOSIS — M7541 Impingement syndrome of right shoulder: Secondary | ICD-10-CM | POA: Diagnosis not present

## 2019-04-03 DIAGNOSIS — M25511 Pain in right shoulder: Secondary | ICD-10-CM | POA: Diagnosis not present

## 2019-04-03 DIAGNOSIS — M25512 Pain in left shoulder: Secondary | ICD-10-CM | POA: Diagnosis not present

## 2019-04-03 DIAGNOSIS — M7521 Bicipital tendinitis, right shoulder: Secondary | ICD-10-CM | POA: Diagnosis not present

## 2019-04-13 DIAGNOSIS — M25511 Pain in right shoulder: Secondary | ICD-10-CM | POA: Diagnosis not present

## 2019-04-13 DIAGNOSIS — M25512 Pain in left shoulder: Secondary | ICD-10-CM | POA: Diagnosis not present

## 2019-05-21 DIAGNOSIS — R1032 Left lower quadrant pain: Secondary | ICD-10-CM | POA: Diagnosis not present

## 2019-05-21 DIAGNOSIS — R140 Abdominal distension (gaseous): Secondary | ICD-10-CM | POA: Diagnosis not present

## 2019-05-21 DIAGNOSIS — R194 Change in bowel habit: Secondary | ICD-10-CM | POA: Diagnosis not present

## 2019-05-22 DIAGNOSIS — R140 Abdominal distension (gaseous): Secondary | ICD-10-CM | POA: Diagnosis not present

## 2019-05-22 DIAGNOSIS — R1032 Left lower quadrant pain: Secondary | ICD-10-CM | POA: Diagnosis not present

## 2019-05-22 DIAGNOSIS — R194 Change in bowel habit: Secondary | ICD-10-CM | POA: Diagnosis not present

## 2019-06-03 DIAGNOSIS — F329 Major depressive disorder, single episode, unspecified: Secondary | ICD-10-CM | POA: Diagnosis not present

## 2019-06-24 DIAGNOSIS — H04123 Dry eye syndrome of bilateral lacrimal glands: Secondary | ICD-10-CM | POA: Diagnosis not present

## 2019-06-24 DIAGNOSIS — H2513 Age-related nuclear cataract, bilateral: Secondary | ICD-10-CM | POA: Diagnosis not present

## 2019-08-17 DIAGNOSIS — I10 Essential (primary) hypertension: Secondary | ICD-10-CM | POA: Diagnosis not present

## 2019-08-17 DIAGNOSIS — M79602 Pain in left arm: Secondary | ICD-10-CM | POA: Diagnosis not present

## 2019-08-18 NOTE — Progress Notes (Signed)
Cardiology Office Note:    Date:  08/20/2019   ID:  Rebecca Wood, DOB 29-Jan-1966, MRN [id number]  PCP:  Rebecca MylarWebb, Carol, MD  Cardiologist:  No primary care provider on file.  Electrophysiologist:  None   Referring MD: Rebecca MylarWebb, Carol, MD   Chief Complaint: chest pain and left arm pain  History of Present Illness:    Rebecca Wood is a 53 y.o. female with a history of atypical chest pain, palpitations, hypertension, GERD, and prior gastric bypass who was previously followed by Rebecca Wood and presents today for further evaluation of recent chest pain and left arm pain.  Patient was previously seen by Rebecca Wood for atypical chest pain but was first seen by Rebecca Wood om 01/2014 for follow-up of abnormal Echo done at outside facility. Echo showed "hyperechoic density of the left atrium." At time of visit, she reported mild dyspnea on exertion as well as generalized fatigue and occasional palpitations. Cardiac MRI was ordered for further evaluation of left atrium and showed mild atrial insufficiency but normal LV/RV size and systolic function. No left atrial mass was noted and findings on Echo were felt to be artifact. Holter monitor was also ordered for further evaluation of palpitations but reportedly showed no significant abnormalities.  At office visit in 09/2016, patient reported chest pain and dyspnea. Exercise Myoview was ordered and was low risk with no evidence of ischemia or prior infarct. Echo was also ordered and showed LVEF of 60-65% with grade 1 diastolic dysfunction. Patient was last seen by Rebecca AusVin Bhagat, PA-C, in 10/2016 for follow-up at which time she noted significant improvement in chest pain. Chest pain was felt to be due to deconditioning, stress, and anxiety. Patient was advised to follow-up as needed.   Patient presents today for follow-up of left arm pain and chest discomfort. Patient was seen by PCP earlier this week on 08/17/2019 at which time she reported left arm pain and fatigue.  PCP was  concerned about possible anginal equivalent. EKG was ordered and showed normal sinus rhythm with no acute ST/T changes. Of note, patient was having some mild chest discomfort at the time of the EKG.  BP was mildly elevated at that time at 149/77.  She was started on HCTZ 25 mg daily.   Most Recent Labs Reviewed: [facility] Total Chol 201, Trig 120, HDL 85, LDL 92. - 08/2019 (from PCP's office): WBC 8.1, Hgb 14.1, Plts 424. Na 139, K 4.5, Glucose 87, Cr 0.65, BUN 102.  Patient here today alone. She reports left arm pain for months now. She describes it as a throbbing pain in her upper left arm (never goes below her elbow. She initially thought it could be musculoskeletal so she saw Ortho a few months ago who got an x-ray and stated she only had mild arthritis. She also saw PT for a while.  However, the pain has continued.  She notes minimal improvement with ibuprofen. She also reports some midsternal chest aching over the last month with rest and with activity but not necessarily worsened with activity. Pain occurs almost daily. She is having some mild aching pain today. Nothing seems to make it better or worse.  During one episode of chest pain, she noted tingling sensation on both sides of jaw.  She also notes the being more easily fatigued with activity as well as some dyspnea on exertion especially when climbing stairs.  She was cleaning out her basement the other week and states she had to stop multiple  times.  Patient does report that she has gained a lot of weight recently and acknowledges this may be contributing to some of her symptoms.  She also acknowledges that she does have some anxiety and is wondering if that is playing a role.  She denies any orthopnea or PND.  She does note mild lower extremity swelling at the end of the day that has improved with compression stockings.  She notes some heart racing when she is stressed or anxious.  During one episode of chest pain, and she notes that  her heart rate was briefly in the 170s on her Apple Watch.  However, she denies any palpitations, lightheadedness, dizziness at that time.  She had one episode of dizziness last week while she was driving but denies any syncope.  No additional dizziness since then.  Patient has a history of tobacco use. She thank you drinks frozen margarita about 4 times a week.  She denies any other recreational drug use. She does have a family history cardiovascular disease.  Her father had a large MI 66s and her brother has had multiple MIs and a stroke (earliest MI in his 66's).   Past Medical History:  Diagnosis Date  . Depression   . Hypertension, essential   . Obesity   . PMDD (premenstrual dysphoric disorder)     Past Surgical History:  Procedure Laterality Date  . GASTRIC BYPASS  2006    Current Medications: Current Meds  Medication Sig  . amLODipine (NORVASC) 10 MG tablet Take 10 mg by mouth daily.  Marland Kitchen CALCIUM PO Take 1 tablet by mouth daily.  . Cyanocobalamin (VITAMIN B 12 PO) Take 1 tablet by mouth daily.  Marland Kitchen dexlansoprazole (DEXILANT) 60 MG capsule Take 60 mg by mouth daily.  . ferrous sulfate 325 (65 FE) MG EC tablet Take 325 mg by mouth daily with breakfast.   . ibuprofen (ADVIL) 200 MG tablet Take 400-800 mg by mouth as needed.  . Methylcellulose, Laxative, (CITRUCEL PO) Take 2 tablets by mouth daily.   . Multiple Vitamin (MULTIVITAMIN) tablet Take 1 tablet by mouth daily.   Marland Kitchen vortioxetine HBr (TRINTELLIX) 10 MG TABS Take 10 mg by mouth 1 day or 1 dose.     Allergies:   Codeine   Social History   Socioeconomic History  . Marital status: Married    Spouse name: Not on file  . Number of children: Not on file  . Years of education: Not on file  . Highest education level: Not on file  Occupational History  . Not on file  Social Needs  . Financial resource strain: Not on file  . Food insecurity    Worry: Not on file    Inability: Not on file  . Transportation needs     Medical: Not on file    Non-medical: Not on file  Tobacco Use  . Smoking status: Never Smoker  . Smokeless tobacco: Never Used  Substance and Sexual Activity  . Alcohol use: Yes    Comment: occasional liquor  . Drug use: No  . Sexual activity: Yes    Birth control/protection: Surgical    Comment: VAS  Lifestyle  . Physical activity    Days per week: Not on file    Minutes per session: Not on file  . Stress: Not on file  Relationships  . Social Musician on phone: Not on file    Gets together: Not on file    Attends religious service: Not  on file    Active member of club or organization: Not on file    Attends meetings of clubs or organizations: Not on file    Relationship status: Not on file  Other Topics Concern  . Not on file  Social History Narrative  . Not on file     Family History: The patient's family history includes Cancer (age of onset: 23) in her mother; Depression in her brother; Heart attack in her brother; Heart attack (age of onset: 47) in her father; Heart disease in her brother and father.  ROS:   Please see the history of present illness.    All other systems reviewed and are negative.  EKGs/Labs/Other Studies Reviewed:    The following studies were reviewed today:  Cardiac MRI 03/12/2014: Impressions: 1.  Normal LV size and systolic function, EF 61%. 2.  Normal RV size and systolic function. 3.  Mild aortic insufficiency. 4. No left atrial mass noted. Suspect finding on echo was artifactual. _______________  Echocardiogram 10/17/2016: Study Conclusions: - Left ventricle: The cavity size was normal. Wall thickness was   normal. Systolic function was normal. The estimated ejection   fraction was in the range of 60% to 65%. Doppler parameters are   consistent with abnormal left ventricular relaxation (grade 1   diastolic dysfunction). _______________  Exercise Myoview 10/18/2016:  Blood pressure demonstrated a normal response to  exercise.  There was no ST segment deviation noted during stress.  The study is normal. There are no perfusion defects consistent with prior infarct or current ischemia.  This is a low risk study.  The left ventricular ejection fraction is hyperdynamic (>65%).  [facility] treadmill score of 8 supports low risk for major cardiac events.  EKG:  EKG not ordered today. EKG from PCP office on 08/17/2019 personally reviewed and demonstrates normal sinus rhythm, rate 72 bpm, with borderline first degree AV block but no acute ST/T changes.  Recent Labs: No results found for requested labs within last 8760 hours.  Recent Lipid Panel    Component Value Date/Time   CHOL 168 02/11/2014 0913   TRIG 130 02/11/2014 0913   HDL 75 02/11/2014 0913   CHOLHDL 2.6 12/27/2012 0843   VLDL 24 12/27/2012 0843   LDLCALC 67 02/11/2014 0913    Physical Exam:    Vital Signs: BP (!) 150/82   Pulse 85   Ht 5\' 3"  (1.6 m)   Wt 218 lb 12.8 oz (99.2 kg)   SpO2 96%   BMI 38.76 kg/m     Wt Readings from Last 3 Encounters:  08/20/19 218 lb 12.8 oz (99.2 kg)  07/25/17 210 lb (95.3 kg)  05/02/17 210 lb (95.3 kg)     General: 53 y.o. female resting comfortably in no acute distress. HEENT: Normocephalic and atraumatic. Sclera clear. EOMs intact. Neck: Supple. No carotid bruits. No obvious JVD. Heart: RRR. Distinct S1 and S2. No murmurs, gallops, or rubs. Radial and distal pedal pulses 2+ and equal bilaterally. Lungs: No increased work of breathing. Clear to ausculation bilaterally. No wheezes, rhonchi, or rales.  Abdomen: Soft, non-distended, and non-tender to palpation. Bowel sounds present. MSK: Normal strength and tone for age. Extremities: No to trace lower extremity edema.    Skin: Warm and dry. Neuro: Alert and oriented x3. No focal deficits. Psych: Normal affect. Responds appropriately.   Assessment:    1. Chest pain, unspecified type   2. Left arm pain   3. DOE (dyspnea on exertion)   4.  Palpitations  5. Dizziness   6. Essential hypertension     Plan:    Chest Pain/Left Arm Pain/ Dyspnea on Exertion - Patient has had left arm pain for the past several months with no significant improvement with NSAIDs and more recently started having substernal chest discomfort that she describes as an aching both at rest and with exertion.  She also notes some dyspnea on exertion and being more easily fatigued. - EKG from 08/17/2019 at PCP's office reviewed and shows normal sinus rhythm with no acute ST/T changes. -Presentation somewhat atypical.  However, patient has a family history significant for CAD. Therefore, we will plan for coronary CT to better define coronary anatomy. One dose of Lopressor ordered prior to the scan. Will also check Echo. - Sublingual nitro prescribed at visit with instructions on how to use.  Palpitations / One Episode of Dizziness - Patient reports occasional heart racing (but no skipping beats) usually when she is anxious/stressed. During one episode of chest pain, she did note that her heart rate was briefly in the 170's on her Apple Watch but denied any palpitations at that time. - She also reports one episode of dizziness while driving recently but denied any palpitations or other cardiac symptoms at the time. No syncope. No additional episodes since then. - Suspect palpations are due to anxiety. Do not think any additional work-up for arrhythmia is necessary at this time. However, I advised patient to let us know if she has any additional episodes of dizziness or if she notices significantly elevated heart rates on Apple Watch again at which point we may need to consider outpatient monitor.   Hypertension - BP elevated 150/82. - Continue amlodipine 10 mg daily. - PCP prescribed HCTZ 25 mg daily earlier this week but patient has not picked up the prescription yet.  Advised patient to go ahead and start this. - Follow-up with PCP as directed.   Disposition:  Follow up after coronary CT. This can be a virtual visit. Patient previously seen by Dr. Shirlee Latch so will also need to get her established with another MD. Can discuss at next visit.   Medication Adjustments/Labs and Tests Ordered: Current medicines are reviewed at length with the patient today.  Concerns regarding medicines are outlined above.  Orders Placed This Encounter  Procedures  . CT CORONARY MORPH W/CTA COR W/SCORE W/CA W/CM &/OR WO/CM  . CT CORONARY FRACTIONAL FLOW RESERVE DATA PREP  . CT CORONARY FRACTIONAL FLOW RESERVE FLUID ANALYSIS  . ECHOCARDIOGRAM COMPLETE   Meds ordered this encounter  Medications  . metoprolol tartrate (LOPRESSOR) 100 MG tablet    Sig: TAKE 1 TABLET BY MOUTH 2 HOURS PRIOR TO YOUR CT    Dispense:  1 tablet    Refill:  0  . nitroGLYCERIN (NITROSTAT) 0.4 MG SL tablet    Sig: Place 1 tablet (0.4 mg total) under the tongue every 5 (five) minutes as needed.    Dispense:  25 tablet    Refill:  3    Patient Instructions  Medication Instructions:  Your physician recommends that you continue on your current medications as directed. Please refer to the Current Medication list given to you today.  *If you need a refill on your cardiac medications before your next appointment, please call your pharmacy*  Lab Work: None ordered  If you have labs (blood work) drawn today and your tests are completely normal, you will receive your results only by: Marland Kitchen MyChart Message (if you have MyChart) OR . A paper  copy in the mail If you have any lab test that is abnormal or we need to change your treatment, we will call you to review the results.  Testing/Procedures:  Your physician has requested that you have an echocardiogram. 08/21/2019 ARRIVE AT 11:05 FOR REGISTRATION.   Echocardiography is a painless test that uses sound waves to create images of your heart. It provides your doctor with information about the size and shape of your heart and how well your heart's  chambers and valves are working. This procedure takes approximately one hour. There are no restrictions for this procedure.    Your physician has requested that you have cardiac CT. Cardiac computed tomography (CT) is a painless test that uses an x-ray machine to take clear, detailed pictures of your heart. For further information please visit [email or web address] Please follow instruction sheet BELOW:  Your cardiac CT will be scheduled at one of the below locations:   [facility] [address] [place], [place] [address] [phone]   If scheduled at [facility], please arrive at the [facility] main entrance of [facility] 30-45 minutes prior to test start time. Proceed to the [facility] Radiology Department (first floor) to check-in and test prep.  Please follow these instructions carefully (unless otherwise directed):   On the Night Before the Test: . Be sure to Drink plenty of water. . Do not consume any caffeinated/decaffeinated beverages or chocolate 12 hours prior to your test. . Do not take any antihistamines 12 hours prior to your test.   On the Day of the Test: . Drink plenty of water. Do not drink any water within one hour of the test. . Do not eat any food 4 hours prior to the test. . You may take your regular medications prior to the test.  . DO NOT TAKE THE HYDROCHLOROTHIAZIDE on the morning of the CT . Take metoprolol (Lopressor) two hours prior to test. . FEMALES- please wear underwire-free bra if available       After the Test: . Drink plenty of water. . After receiving IV contrast, you may experience a mild flushed feeling. This is normal. . On occasion, you may experience a mild rash up to 24 hours after the test. This is not dangerous. If this occurs, you can take Benadryl 25 mg and increase your fluid intake. . If you experience trouble breathing, this can be serious. If it is severe call 911 IMMEDIATELY. If it is mild,  please call our office.   Once we have confirmed authorization from your insurance company, we will call you to set up a date and time for your test.   For non-scheduling related questions, please contact the cardiac imaging nurse navigator should you have any questions/concerns: Rockwell Alexandria, RN Navigator Cardiac Imaging [facility] [phone] Office      Follow-Up: At [facility], you and your health needs are our priority.  As part of our continuing mission to provide you with exceptional heart care, we have created designated Provider Care Teams.  These Care Teams include your primary Cardiologist (physician) and Advanced Practice Providers (APPs -  Physician Assistants and Nurse Practitioners) who all work together to provide you with the care you need, when you need it.  Your next appointment:   AFTER YOUR CT  I WILL HAVE TO CALL YOU TO SET THIS UP ONCE YOUR CT HAS BEEN SCHEDULED   The format for your next appointment:   Virtual  Visit   Provider:   Robbie Lis, PA-C  Other Instructions      Signed, Eppie Gibson  08/20/2019 2:55 PM    [place]

## 2019-08-20 ENCOUNTER — Encounter: Payer: Self-pay | Admitting: Student

## 2019-08-20 ENCOUNTER — Other Ambulatory Visit: Payer: Self-pay

## 2019-08-20 ENCOUNTER — Ambulatory Visit: Payer: BC Managed Care – PPO | Admitting: Student

## 2019-08-20 VITALS — BP 150/82 | HR 85 | Ht 63.0 in | Wt 218.8 lb

## 2019-08-20 DIAGNOSIS — R0600 Dyspnea, unspecified: Secondary | ICD-10-CM | POA: Diagnosis not present

## 2019-08-20 DIAGNOSIS — R079 Chest pain, unspecified: Secondary | ICD-10-CM

## 2019-08-20 DIAGNOSIS — M79602 Pain in left arm: Secondary | ICD-10-CM | POA: Diagnosis not present

## 2019-08-20 DIAGNOSIS — I10 Essential (primary) hypertension: Secondary | ICD-10-CM

## 2019-08-20 DIAGNOSIS — R002 Palpitations: Secondary | ICD-10-CM

## 2019-08-20 DIAGNOSIS — R42 Dizziness and giddiness: Secondary | ICD-10-CM

## 2019-08-20 DIAGNOSIS — R0609 Other forms of dyspnea: Secondary | ICD-10-CM

## 2019-08-20 MED ORDER — NITROGLYCERIN 0.4 MG SL SUBL: 0.4000 mg | SUBLINGUAL_TABLET | SUBLINGUAL | 3 refills | Status: DC | PRN

## 2019-08-20 MED ORDER — METOPROLOL TARTRATE 100 MG PO TABS: ORAL_TABLET | ORAL | 0 refills | Status: DC

## 2019-08-20 NOTE — Patient Instructions (Addendum)
Medication Instructions:  Your physician recommends that you continue on your current medications as directed. Please refer to the Current Medication list given to you today.  *If you need a refill on your cardiac medications before your next appointment, please call your pharmacy*  Lab Work: None ordered  If you have labs (blood work) drawn today and your tests are completely normal, you will receive your results only by: Marland Kitchen MyChart Message (if you have MyChart) OR . A paper copy in the mail If you have any lab test that is abnormal or we need to change your treatment, we will call you to review the results.  Testing/Procedures:  Your physician has requested that you have an echocardiogram. 08/21/2019 ARRIVE AT 11:05 FOR REGISTRATION.   Echocardiography is a painless test that uses sound waves to create images of your heart. It provides your doctor with information about the size and shape of your heart and how well your heart's chambers and valves are working. This procedure takes approximately one hour. There are no restrictions for this procedure.    Your physician has requested that you have cardiac CT. Cardiac computed tomography (CT) is a painless test that uses an x-ray machine to take clear, detailed pictures of your heart. For further information please visit [email or web address] Please follow instruction sheet BELOW:  Your cardiac CT will be scheduled at one of the below locations:   [facility] [address] [place], [place] [address] [phone]   If scheduled at [facility], please arrive at the [facility] main entrance of [facility] 30-45 minutes prior to test start time. Proceed to the [facility] Radiology Department (first floor) to check-in and test prep.  Please follow these instructions carefully (unless otherwise directed):   On the Night Before the Test: . Be sure to Drink plenty of water. . Do not consume any  caffeinated/decaffeinated beverages or chocolate 12 hours prior to your test. . Do not take any antihistamines 12 hours prior to your test.   On the Day of the Test: . Drink plenty of water. Do not drink any water within one hour of the test. . Do not eat any food 4 hours prior to the test. . You may take your regular medications prior to the test.  . DO NOT TAKE THE HYDROCHLOROTHIAZIDE on the morning of the CT . Take metoprolol (Lopressor) two hours prior to test. . FEMALES- please wear underwire-free bra if available       After the Test: . Drink plenty of water. . After receiving IV contrast, you may experience a mild flushed feeling. This is normal. . On occasion, you may experience a mild rash up to 24 hours after the test. This is not dangerous. If this occurs, you can take Benadryl 25 mg and increase your fluid intake. . If you experience trouble breathing, this can be serious. If it is severe call 911 IMMEDIATELY. If it is mild, please call our office.   Once we have confirmed authorization from your insurance company, we will call you to set up a date and time for your test.   For non-scheduling related questions, please contact the cardiac imaging nurse navigator should you have any questions/concerns: Rockwell Alexandria, RN Navigator Cardiac Imaging [facility] [phone] Office      Follow-Up: At [facility], you and your health needs are our priority.  As part of our continuing mission to provide you with exceptional heart care,  we have created designated Provider Care Teams.  These Care Teams include your primary Cardiologist (physician) and Advanced Practice Providers (APPs -  Physician Assistants and Nurse Practitioners) who all work together to provide you with the care you need, when you need it.  Your next appointment:   AFTER YOUR CT  I WILL HAVE TO CALL YOU TO SET THIS UP ONCE YOUR CT HAS BEEN SCHEDULED   The format for your next  appointment:   Virtual Visit   Provider:   Robbie Lis, PA-C  Other Instructions

## 2019-08-21 ENCOUNTER — Ambulatory Visit (HOSPITAL_COMMUNITY): Payer: BC Managed Care – PPO | Attending: Cardiology

## 2019-08-21 DIAGNOSIS — R079 Chest pain, unspecified: Secondary | ICD-10-CM | POA: Insufficient documentation

## 2019-08-21 DIAGNOSIS — R0600 Dyspnea, unspecified: Secondary | ICD-10-CM | POA: Insufficient documentation

## 2019-08-21 DIAGNOSIS — R0609 Other forms of dyspnea: Secondary | ICD-10-CM

## 2019-09-01 DIAGNOSIS — F411 Generalized anxiety disorder: Secondary | ICD-10-CM | POA: Diagnosis not present

## 2019-09-01 DIAGNOSIS — F329 Major depressive disorder, single episode, unspecified: Secondary | ICD-10-CM | POA: Diagnosis not present

## 2019-09-25 ENCOUNTER — Encounter: Payer: Self-pay | Admitting: *Deleted

## 2019-10-21 ENCOUNTER — Ambulatory Visit (HOSPITAL_COMMUNITY): Payer: BC Managed Care – PPO

## 2019-10-27 ENCOUNTER — Telehealth: Payer: BC Managed Care – PPO | Admitting: Physician Assistant

## 2019-11-02 ENCOUNTER — Encounter: Payer: Self-pay | Admitting: *Deleted

## 2020-02-08 DIAGNOSIS — Z0000 Encounter for general adult medical examination without abnormal findings: Secondary | ICD-10-CM | POA: Diagnosis not present

## 2020-02-08 DIAGNOSIS — Z13220 Encounter for screening for lipoid disorders: Secondary | ICD-10-CM | POA: Diagnosis not present

## 2020-02-08 DIAGNOSIS — I10 Essential (primary) hypertension: Secondary | ICD-10-CM | POA: Diagnosis not present

## 2020-02-08 LAB — COMPREHENSIVE METABOLIC PANEL
Albumin: 4.3 (ref 3.5–5.0)
Calcium: 8.8 (ref 8.7–10.7)
GFR calc Af Amer: 101
GFR calc non Af Amer: 84

## 2020-02-08 LAB — HEPATIC FUNCTION PANEL
ALT: 38 — AB (ref 7–35)
AST: 32 (ref 13–35)
Alkaline Phosphatase: 83 (ref 25–125)
Bilirubin, Total: 0.5

## 2020-02-08 LAB — BASIC METABOLIC PANEL
BUN: 12 (ref 4–21)
CO2: 27 — AB (ref 13–22)
Chloride: 100 (ref 99–108)
Creatinine: 0.7 (ref 0.5–1.1)
Glucose: 89
Potassium: 4.6 (ref 3.4–5.3)
Sodium: 138 (ref 137–147)

## 2020-03-08 DIAGNOSIS — F411 Generalized anxiety disorder: Secondary | ICD-10-CM | POA: Diagnosis not present

## 2020-03-08 DIAGNOSIS — F329 Major depressive disorder, single episode, unspecified: Secondary | ICD-10-CM | POA: Diagnosis not present

## 2020-06-07 DIAGNOSIS — F329 Major depressive disorder, single episode, unspecified: Secondary | ICD-10-CM | POA: Diagnosis not present

## 2020-06-07 DIAGNOSIS — F411 Generalized anxiety disorder: Secondary | ICD-10-CM | POA: Diagnosis not present

## 2020-09-21 DIAGNOSIS — F411 Generalized anxiety disorder: Secondary | ICD-10-CM | POA: Diagnosis not present

## 2020-09-21 DIAGNOSIS — R0602 Shortness of breath: Secondary | ICD-10-CM | POA: Diagnosis not present

## 2020-09-21 DIAGNOSIS — Z13220 Encounter for screening for lipoid disorders: Secondary | ICD-10-CM | POA: Diagnosis not present

## 2020-09-21 DIAGNOSIS — I10 Essential (primary) hypertension: Secondary | ICD-10-CM | POA: Diagnosis not present

## 2020-09-21 LAB — HEPATIC FUNCTION PANEL
ALT: 58 — AB (ref 7–35)
AST: 58 — AB (ref 13–35)
Alkaline Phosphatase: 80 (ref 25–125)
Bilirubin, Total: 0.6

## 2020-09-21 LAB — BASIC METABOLIC PANEL
BUN: 12 (ref 4–21)
CO2: 29 — AB (ref 13–22)
Chloride: 101 (ref 99–108)
Creatinine: 0.9 (ref 0.5–1.1)
Glucose: 93
Potassium: 4.6 (ref 3.4–5.3)
Sodium: 139 (ref 137–147)

## 2020-09-22 LAB — COMPREHENSIVE METABOLIC PANEL
Albumin: 4.2 (ref 3.5–5.0)
Calcium: 9.2 (ref 8.7–10.7)
GFR calc Af Amer: 75
GFR calc non Af Amer: 62

## 2020-10-12 DIAGNOSIS — I10 Essential (primary) hypertension: Secondary | ICD-10-CM | POA: Diagnosis not present

## 2020-10-20 ENCOUNTER — Ambulatory Visit (INDEPENDENT_AMBULATORY_CARE_PROVIDER_SITE_OTHER): Payer: BC Managed Care – PPO | Admitting: Family Medicine

## 2020-10-20 ENCOUNTER — Other Ambulatory Visit: Payer: Self-pay

## 2020-10-20 ENCOUNTER — Encounter (INDEPENDENT_AMBULATORY_CARE_PROVIDER_SITE_OTHER): Payer: Self-pay | Admitting: Family Medicine

## 2020-10-20 VITALS — BP 142/72 | HR 76 | Temp 97.6°F | Ht 63.0 in | Wt 218.0 lb

## 2020-10-20 DIAGNOSIS — F39 Unspecified mood [affective] disorder: Secondary | ICD-10-CM

## 2020-10-20 DIAGNOSIS — Z9189 Other specified personal risk factors, not elsewhere classified: Secondary | ICD-10-CM

## 2020-10-20 DIAGNOSIS — Z0289 Encounter for other administrative examinations: Secondary | ICD-10-CM

## 2020-10-20 DIAGNOSIS — R5383 Other fatigue: Secondary | ICD-10-CM

## 2020-10-20 DIAGNOSIS — M2550 Pain in unspecified joint: Secondary | ICD-10-CM | POA: Diagnosis not present

## 2020-10-20 DIAGNOSIS — I10 Essential (primary) hypertension: Secondary | ICD-10-CM | POA: Diagnosis not present

## 2020-10-20 DIAGNOSIS — Z6838 Body mass index (BMI) 38.0-38.9, adult: Secondary | ICD-10-CM

## 2020-10-20 DIAGNOSIS — R0602 Shortness of breath: Secondary | ICD-10-CM | POA: Diagnosis not present

## 2020-10-20 DIAGNOSIS — E538 Deficiency of other specified B group vitamins: Secondary | ICD-10-CM | POA: Insufficient documentation

## 2020-10-20 DIAGNOSIS — Z9884 Bariatric surgery status: Secondary | ICD-10-CM | POA: Insufficient documentation

## 2020-10-20 DIAGNOSIS — E559 Vitamin D deficiency, unspecified: Secondary | ICD-10-CM | POA: Insufficient documentation

## 2020-10-20 DIAGNOSIS — G8929 Other chronic pain: Secondary | ICD-10-CM

## 2020-10-21 LAB — CBC WITH DIFFERENTIAL/PLATELET
Basophils Absolute: 0 10*3/uL (ref 0.0–0.2)
Basos: 1 %
EOS (ABSOLUTE): 0.2 10*3/uL (ref 0.0–0.4)
Eos: 2 %
Hematocrit: 43.5 % (ref 34.0–46.6)
Hemoglobin: 14.9 g/dL (ref 11.1–15.9)
Immature Grans (Abs): 0 10*3/uL (ref 0.0–0.1)
Immature Granulocytes: 0 %
Lymphocytes Absolute: 1.6 10*3/uL (ref 0.7–3.1)
Lymphs: 21 %
MCH: 30.7 pg (ref 26.6–33.0)
MCHC: 34.3 g/dL (ref 31.5–35.7)
MCV: 90 fL (ref 79–97)
Monocytes Absolute: 0.8 10*3/uL (ref 0.1–0.9)
Monocytes: 10 %
Neutrophils Absolute: 5.2 10*3/uL (ref 1.4–7.0)
Neutrophils: 66 %
Platelets: 418 10*3/uL (ref 150–450)
RBC: 4.86 x10E6/uL (ref 3.77–5.28)
RDW: 12.7 % (ref 11.7–15.4)
WBC: 7.9 10*3/uL (ref 3.4–10.8)

## 2020-10-21 LAB — COMPREHENSIVE METABOLIC PANEL
ALT: 77 IU/L — ABNORMAL HIGH (ref 0–32)
AST: 83 IU/L — ABNORMAL HIGH (ref 0–40)
Albumin/Globulin Ratio: 1.9 (ref 1.2–2.2)
Albumin: 4.4 g/dL (ref 3.8–4.9)
Alkaline Phosphatase: 101 IU/L (ref 44–121)
BUN/Creatinine Ratio: 11 (ref 9–23)
BUN: 7 mg/dL (ref 6–24)
Bilirubin Total: 1 mg/dL (ref 0.0–1.2)
CO2: 21 mmol/L (ref 20–29)
Calcium: 9.1 mg/dL (ref 8.7–10.2)
Chloride: 98 mmol/L (ref 96–106)
Creatinine, Ser: 0.64 mg/dL (ref 0.57–1.00)
GFR calc Af Amer: 115 mL/min/{1.73_m2} (ref >59)
GFR calc non Af Amer: 100 mL/min/{1.73_m2} (ref >59)
Globulin, Total: 2.3 g/dL (ref 1.5–4.5)
Glucose: 84 mg/dL (ref 65–99)
Potassium: 4.2 mmol/L (ref 3.5–5.2)
Sodium: 137 mmol/L (ref 134–144)
Total Protein: 6.7 g/dL (ref 6.0–8.5)

## 2020-10-21 LAB — VITAMIN B12: Vitamin B-12: 1309 pg/mL — ABNORMAL HIGH (ref 232–1245)

## 2020-10-21 LAB — LIPID PANEL
Chol/HDL Ratio: 2.3 ratio (ref 0.0–4.4)
Cholesterol, Total: 208 mg/dL — ABNORMAL HIGH (ref 100–199)
HDL: 89 mg/dL (ref >39)
LDL Chol Calc (NIH): 97 mg/dL (ref 0–99)
Triglycerides: 129 mg/dL (ref 0–149)
VLDL Cholesterol Cal: 22 mg/dL (ref 5–40)

## 2020-10-21 LAB — T3: T3, Total: 157 ng/dL (ref 71–180)

## 2020-10-21 LAB — T4, FREE: Free T4: 1.29 ng/dL (ref 0.82–1.77)

## 2020-10-21 LAB — HEMOGLOBIN A1C
Est. average glucose Bld gHb Est-mCnc: 105 mg/dL
Hgb A1c MFr Bld: 5.3 % (ref 4.8–5.6)

## 2020-10-21 LAB — INSULIN, RANDOM: INSULIN: 5.7 u[IU]/mL (ref 2.6–24.9)

## 2020-10-21 LAB — VITAMIN D 25 HYDROXY (VIT D DEFICIENCY, FRACTURES): Vit D, 25-Hydroxy: 27.4 ng/mL — ABNORMAL LOW (ref 30.0–100.0)

## 2020-10-21 LAB — FOLATE: Folate: 4.8 ng/mL (ref >3.0)

## 2020-10-21 LAB — TSH: TSH: 1.87 u[IU]/mL (ref 0.450–4.500)

## 2020-10-24 NOTE — Progress Notes (Signed)
Chief Complaint:   OBESITY Rebecca Wood (MR# [id number]) is a 55 y.o. female who presents for evaluation and treatment of obesity and related comorbidities. Current BMI is Body mass index is 38.62 kg/m. [name] has been struggling with her weight for many years and has been unsuccessful in either losing weight, maintaining weight loss, or reaching her healthy weight goal.  [name] is currently in the action stage of change and ready to dedicate time achieving and maintaining a healthier weight. [name] is interested in becoming our patient and working on intensive lifestyle modifications including (but not limited to) diet and exercise for weight loss.  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 She is married to [name], age 4. She has no kids. She eats out 6-8 times a week on average. She craves fast foods a lot.  [name]'s habits were reviewed today and are as follows: Her family eats meals together, she thinks her family will eat healthier with her, her desired weight loss is 53 lbs, she has been heavy most of her life, she started gaining weight after high school but was overweight in junior high and high school, her heaviest weight ever was 275 pounds, she has significant food cravings issues, she is frequently drinking liquids with calories, she frequently makes poor food choices, she has problems with excessive hunger, she frequently eats larger portions than normal and she struggles with emotional eating.  This is the patient's first visit at [facility].  The patient's NEW PATIENT PACKET that they filled out prior to today's office visit was reviewed at length and information from that paperwork was included within the following office visit note.    Included in the packet: current and past health history, medications, allergies, ROS, gynecologic history (women only), surgical history, family history, social history, weight history, weight loss surgery history (for those that have had  weight loss surgery), nutritional evaluation, mood and food questionnaire along with a depression screening (PHQ9) on all patients, an Epworth questionnaire, sleep habits questionnaire, patient life and health improvement goals questionnaire. These will all be scanned into the patient's chart under media.   During the visit, I independently reviewed the patient's EKG, bioimpedance scale results, and indirect calorimeter results. I used this information to tailor a meal plan for the patient that will help [name] to lose weight and will improve her obesity-related conditions going forward.  I performed a medically necessary appropriate examination and/or evaluation. I discussed the assessment and treatment plan with the patient. The patient was provided an opportunity to ask questions and all were answered. The patient agreed with the plan and demonstrated an understanding of the instructions. Labs were ordered today (unless patient declined them) and will be reviewed with the patient at our next visit unless more critical results need to be addressed immediately. Clinical information was updated and documented in the EMR.  Time spent on visit including pre-visit chart review and post-visit care was estimated to be 55 minutes.  A separate 15 minutes was spent on risk counseling (see above/below).   Depression Screen [name]'s Food and Mood (modified PHQ-9) score was 18.  Depression screen PHQ 2/9 10/20/2020  Decreased Interest 3  Down, Depressed, Hopeless 1  PHQ - 2 Score 4  Altered sleeping 3  Tired, decreased energy 3  Change in appetite 2  Feeling bad or failure about yourself  2  Trouble concentrating 1  Moving slowly or fidgety/restless 3  Suicidal thoughts 0  PHQ-9 Score 18  Difficult doing work/chores Very difficult     Assessment/Plan:   1. Other fatigue [name] admits to daytime somnolence and admits to waking up still tired. Patent has a history of symptoms of daytime fatigue and morning  headache. [name] generally gets 6 or 7 hours of sleep per night, and states that she has poor sleep quality. Snoring is present. Apneic episodes are not present. Epworth Sleepiness Score is 14.  Plan: Kathrene does feel that her weight is causing her energy to be lower than it should be. Fatigue may be related to obesity, depression or many other causes. Labs will be ordered, and in the meanwhile, [name] will contact PCP about elevated ESS at 14 and she will focus on self care including making healthy food choices, increasing physical activity and focusing on stress reduction. Check labs today.  Orders - EKG 12-Lead - Vitamin B12 - CBC with Differential/Platelet - Comprehensive metabolic panel - Folate - Hemoglobin A1c - Insulin, random - Lipid panel - T3 - T4, free - TSH - VITAMIN D 25 Hydroxy (Vit-D Deficiency, Fractures)   2. SOBOE (shortness of breath on exertion) [name] notes increasing shortness of breath with exercising and seems to be worsening over time with weight gain. She notes getting out of breath sooner with activity than she used to. This has gotten worse recently. [name] denies shortness of breath at rest or orthopnea.  Plan: Tashanna does feel that she gets out of breath more easily that she used to when she exercises. [name]'s shortness of breath appears to be obesity related and exercise induced. She has agreed to work on weight loss and gradually increase exercise to treat her exercise induced shortness of breath. Will continue to monitor closely. Check labs today.  Orders - Vitamin B12 - CBC with Differential/Platelet - Comprehensive metabolic panel - Folate - Hemoglobin A1c - Insulin, random - Lipid panel - T3 - T4, free - TSH - VITAMIN D 25 Hydroxy (Vit-D Deficiency, Fractures)   3. Essential hypertension [name] reports having had some "twinges" in her arm with exercise from time to time but these sx have resolved now. No other associated symptoms. She is currently  asymptomatic. She had similar symptoms in the past and was seen by cardiology and she was cleared/ there was no need for further follow up. Patient does not see cardiology regularly. Jaylie reports at home blood pressures in the 140's/88-90 on average. She is taking Norvasc and has been on losartan/HCTZ for about 3 weeks- which she is currently working with PCP on controlling it better.  Plan:  Robins's blood pressure is not at goal today. I recommend no exercise until she is seen by PCP to discuss her recent history.    Patient is actively being managed by PCP for blood pressure- change in med recently occurred. Practice home blood pressure monitoring daily and bring log in to next OV.  Prudent nutritional plan, low-salt and weight loss.  check labs today  - Vitamin B12 - CBC with Differential/Platelet - Comprehensive metabolic panel - Folate - Hemoglobin A1c - Insulin, random - Lipid panel - T3 - T4, free - TSH - VITAMIN D 25 Hydroxy (Vit-D Deficiency, Fractures)   4. Chronic joint pain Eboney reports back, knees, hips, etc pain. She takes OTC pain medicine as needed.   Plan: Check labs today.Continue current treatment plan per PCP. Weight loss via prudent nutritional plan.    5. Vitamin B 12 deficiency She notes fatigue. She is not a vegetarian.  She does  not have a previous diagnosis of pernicious anemia.  She does not have a history of weight loss surgery. She is not on an OTC sup[plement currently.  Lab Results  Component Value Date   VITAMINB12 1,309 (H) 10/20/2020    Plan: The diagnosis was reviewed with the patient. Counseling provided today, see below. We will continue to monitor. Orders and follow up as documented in patient record. Check labs today.  Counseling . The body needs vitamin B12: to make red blood cells; to make DNA; and to help the nerves work properly so they can carry messages from the brain to the body.  . The main causes of vitamin B12 deficiency include  dietary deficiency, digestive diseases, pernicious anemia, and having a surgery in which part of the stomach or small intestine is removed.  . Certain medicines can make it harder for the body to absorb vitamin B12. These medicines include: heartburn medications; some antibiotics; some medications used to treat diabetes, gout, and high cholesterol.  . In some cases, there are no symptoms of this condition. If the condition leads to anemia or nerve damage, various symptoms can occur, such as weakness or fatigue, shortness of breath, and numbness or tingling in your hands and feet.   . Treatment:  o May include taking vitamin B12 supplements.  o Avoid alcohol.  o Eat lots of healthy foods that contain vitamin B12: - Beef, pork, chicken, [place], and organ meats, such as liver.  - Seafood: This includes clams, rainbow trout, salmon, tuna, and haddock. Eggs.  - Cereal and dairy products that are fortified: This means that vitamin B12 has been added to the food.   Orders - Vitamin B12 - CBC with Differential/Platelet - Comprehensive metabolic panel - Folate - Hemoglobin A1c - Insulin, random - Lipid panel - T3 - T4, free - TSH - VITAMIN D 25 Hydroxy (Vit-D Deficiency, Fractures)   6. S/P gastric bypass Gwendelyn had gastric bypass in 2006 at [facility] by Dr. Evert Kohl. Her initial weight was 268 lbs and she got down to 135-140 within 12 months.  Plan: Check labs today.   7. Vitamin D deficiency Alaijah is currently taking no vitamin D supplement. She denies nausea, vomiting or muscle weakness.  Plan: Check labs today. Low Vitamin D level contributes to fatigue and are associated with obesity, breast, and colon cancer. She agrees to continue to take prescription Vitamin D @50 ,000 IU every week and will follow-up for routine testing of Vitamin D, at least 2-3 times per year to avoid over-replacement.  Orders - Vitamin B12 - CBC with Differential/Platelet - Comprehensive  metabolic panel - Folate - Hemoglobin A1c - Insulin, random - Lipid panel - T3 - T4, free - TSH - VITAMIN D 25 Hydroxy (Vit-D Deficiency, Fractures)   8. Mood disorder (HCC), with emotional eating Landrie has depression and anxiety. She is on Trintellix per Ms. Zella Ball, NP with psychiatry group in [place]. She has a PHQ score of 18.   Plan: Follow up with psychiatry provider for care of mood. I recommend patient obtain a counselor for CBT. She states she will speak with psychiatry about setting this up.   9. At risk for impaired metabolic function Piedad was given approximately 26 minutes of impaired  metabolic function prevention counseling today. We discussed intensive lifestyle modifications today with an emphasis on specific nutrition and exercise instructions and strategies.    10. Class 2 severe obesity with serious comorbidity and body mass index (BMI) of  38.0 to 38.9 in adult, unspecified obesity type (HCC) Zella BallRobin is currently in the action stage of change and her goal is to continue with weight loss efforts. I recommend Zella BallRobin begin the structured treatment plan as follows:  She has agreed to the Category 3 Plan.  Exercise goals: As is   Behavioral modification strategies: increasing lean protein intake, decreasing simple carbohydrates, meal planning and cooking strategies, keeping healthy foods in the home and planning for success.  She was informed of the importance of frequent follow-up visits to maximize her success with intensive lifestyle modifications for her multiple health conditions. She was informed we would discuss her lab results at her next visit unless there is a critical issue that needs to be addressed sooner. Zella BallRobin agreed to keep her next visit at the agreed upon time to discuss these results.   Objective:   Blood pressure (!) 142/72, pulse 76, temperature 97.6 F (36.4 C), height 5\' 3"  (1.6 m), weight 218 lb (98.9 kg), SpO2 97 %. Body mass index is 38.62  kg/m.  EKG: Normal sinus rhythm, rate 74.  Indirect Calorimeter completed today shows a VO2 of 320 and a REE of 2226.  Her calculated basal metabolic rate is [id number] thus her basal metabolic rate is better than expected.  General: Cooperative, alert, well developed, in no acute distress. HEENT: Conjunctivae and lids unremarkable. Cardiovascular: Regular rhythm.  Lungs: Normal work of breathing. Neurologic: No focal deficits.   Lab Results  Component Value Date   CREATININE 0.64 10/20/2020   BUN 7 10/20/2020   NA 137 10/20/2020   K 4.2 10/20/2020   CL 98 10/20/2020   CO2 21 10/20/2020   Lab Results  Component Value Date   ALT 77 (H) 10/20/2020   AST 83 (H) 10/20/2020   ALKPHOS 101 10/20/2020   BILITOT 1.0 10/20/2020   Lab Results  Component Value Date   HGBA1C 5.3 10/20/2020   Lab Results  Component Value Date   INSULIN 5.7 10/20/2020   Lab Results  Component Value Date   TSH 1.870 10/20/2020   Lab Results  Component Value Date   CHOL 208 (H) 10/20/2020   HDL 89 10/20/2020   LDLCALC 97 10/20/2020   TRIG 129 10/20/2020   CHOLHDL 2.3 10/20/2020   Lab Results  Component Value Date   WBC 7.9 10/20/2020   HGB 14.9 10/20/2020   HCT 43.5 10/20/2020   MCV 90 10/20/2020   PLT 418 10/20/2020   Attestation Statements:   Reviewed by clinician on day of visit: allergies, medications, problem list, medical history, surgical history, family history, social history, and previous encounter notes.  Edmund HildaI, Tamesha Frazier, am acting as Energy managertranscriptionist for [employer] & McLennanDeborah De Libman, DO.  I have reviewed the above documentation for accuracy and completeness, and I agree with the above. Carlye Grippe-  [place] Julyan Gales, D.O.  The 21st Century Cures Act was signed into law in 2016 which includes the topic of electronic health records.  This provides immediate access to information in MyChart.  This includes consultation notes, operative notes, office notes, lab results and pathology reports.  If you  have any questions about what you read please let us know at your next visit so we can discuss your concerns and take corrective action if need be.  We are right here with you.

## 2020-11-01 ENCOUNTER — Ambulatory Visit (INDEPENDENT_AMBULATORY_CARE_PROVIDER_SITE_OTHER): Payer: Self-pay | Admitting: Family Medicine

## 2020-11-03 ENCOUNTER — Ambulatory Visit (INDEPENDENT_AMBULATORY_CARE_PROVIDER_SITE_OTHER): Payer: BC Managed Care – PPO | Admitting: Family Medicine

## 2020-11-03 ENCOUNTER — Other Ambulatory Visit: Payer: Self-pay

## 2020-11-03 ENCOUNTER — Encounter (INDEPENDENT_AMBULATORY_CARE_PROVIDER_SITE_OTHER): Payer: Self-pay | Admitting: Family Medicine

## 2020-11-03 VITALS — BP 138/70 | HR 80 | Temp 97.9°F | Ht 63.0 in | Wt 215.0 lb

## 2020-11-03 DIAGNOSIS — I10 Essential (primary) hypertension: Secondary | ICD-10-CM

## 2020-11-03 DIAGNOSIS — E8881 Metabolic syndrome: Secondary | ICD-10-CM | POA: Diagnosis not present

## 2020-11-03 DIAGNOSIS — E559 Vitamin D deficiency, unspecified: Secondary | ICD-10-CM | POA: Diagnosis not present

## 2020-11-03 DIAGNOSIS — Z9189 Other specified personal risk factors, not elsewhere classified: Secondary | ICD-10-CM

## 2020-11-03 DIAGNOSIS — Z6838 Body mass index (BMI) 38.0-38.9, adult: Secondary | ICD-10-CM

## 2020-11-03 DIAGNOSIS — R7989 Other specified abnormal findings of blood chemistry: Secondary | ICD-10-CM

## 2020-11-03 MED ORDER — VITAMIN D (ERGOCALCIFEROL) 1.25 MG (50000 UNIT) PO CAPS: 50000.0000 [IU] | ORAL_CAPSULE | ORAL | 0 refills | Status: DC

## 2020-11-07 NOTE — Progress Notes (Signed)
Chief Complaint:   OBESITY Rebecca Wood is here to discuss her progress with her obesity treatment plan along with follow-up of her obesity related diagnoses. Rebecca Wood is on the Category 3 Plan and states she is following her eating plan approximately 70% of the time. Rebecca Wood states she is walking and stationary bike 30 minutes 7 times per week.  Today's visit was #: 2 Starting weight: 218 lbs Starting date: 10/20/2020 Today's weight: 215 lbs Today's date: 11/03/2020 Total lbs lost to date: 3 lbs Total lbs lost since last in-office visit: 3 lbs Total weight loss percentage to date: -1.38%  Interim History:  Rebecca Wood is here today to review her NEW Meal Plan and to discuss all recent labs done here and/ or done at outside facilities.  Extended time was spent counseling Rebecca Wood on all new disease processes that were discovered or that are worsening.  Rebecca Wood notes things went well up until she went to the beach. She was drinking 2 liter diet pepsi, now down to 1 can a day.  Pt is drinking a lot more water and non-calorie beverages . She denies issues with plan. She denies hunger and cravings.   Assessment/Plan:   1. Vitamin D deficiency New. Discussed labs with patient today. Rebecca Wood's Vitamin D level was 27.4 on 10/20/2020. She is currently taking no vitamin D supplement. She denies nausea, vomiting or muscle weakness but notes fatigue/tired.   Ref. Range 10/20/2020 10:28  Vitamin D, 25-Hydroxy Latest Ref Range: 30.0 - 100.0 ng/mL 27.4 (L)   Plan: Start prescription Vit D and recheck in 3 months. Low Vitamin D level contributes to fatigue and are associated with obesity, breast, and colon cancer. She agrees to Start to take prescription Vitamin D @50 ,000 IU every week and will follow-up for routine testing of Vitamin D, at least 2-3 times per year to avoid over-replacement.  2. Essential hypertension Discussed labs with patient today. Pt's PCP recently added BP med about 4-6 weeks ago. She is taking  Norvasc and Hyzaar. She is asymptomatic without concerns. Pt tolerating meds well without side effects.   BP Readings from Last 3 Encounters:  11/03/20 138/70  10/20/20 (!) 142/72  08/20/19 (!) 150/82   Lab Results  Component Value Date   NA 137 10/20/2020   K 4.2 10/20/2020   CO2 21 10/20/2020   GLUCOSE 84 10/20/2020   BUN 7 10/20/2020   CREATININE 0.64 10/20/2020   CALCIUM 9.1 10/20/2020   GFRNONAA 100 10/20/2020   GFRAA 115 10/20/2020    Plan: BP is unstable/uncontrolled. Goal is 130/80. Practice home blood pressure monitoring and bring log into each visit with 12/18/2020 and PCP. Decrease salt, prudent nutritional plan, weight loss. Continue exercise as is.  3. Insulin resistance New. Discussed labs with patient today.  Rebecca Wood has a diagnosis of prediabetes based on her elevated HgA1c and was informed this puts her at greater risk of developing diabetes. She continues to work on diet and exercise to decrease her risk of diabetes. She denies nausea or hypoglycemia. Rebecca Wood has a diagnosis of insulin resistance based on her elevated fasting insulin level >5.  Lab Results  Component Value Date   HGBA1C 5.3 10/20/2020   Lab Results  Component Value Date   INSULIN 5.7 10/20/2020   Plan: We discussed meds but pt does not wish to start them at this time. Rebecca Wood will continue to work on weight loss, exercise, and decreasing simple carbohydrates to help decrease the risk of diabetes. Rebecca Wood agreed  to follow-up with [place] as directed to closely monitor her progress.  4. Elevated LFTs Discussed labs with patient today. Slightly elevated only 2x's ULN. CMP last done with [facility] 2014 when they were within normal limits. Pt is asymptomatic. She denies GI concerns. She denies excessive alcohol intake or use of Tylenol. Rebecca Wood has a new dx of elevated ALT. Her BMI is over 40. She denies abdominal pain or jaundice and has never been told of any liver problems in the past. She denies excessive alcohol  intake.  Lab Results  Component Value Date   ALT 77 (H) 10/20/2020   AST 83 (H) 10/20/2020   ALKPHOS 101 10/20/2020   BILITOT 1.0 10/20/2020   Plan: Recheck in 3 months or so. Pt will contact her [facility] PCP to see trend in the past. Decrease Tylenol, decrease alcohol, decrease fatty meats and weight loss advised. We discussed the possibility of fatty liver vs start of another process and I recommend she follow up with PCP for this.   5. At risk for impaired metabolic function Rebecca Wood was given approximately 26 minutes of impaired  metabolic function prevention counseling today. We discussed intensive lifestyle modifications today with an emphasis on specific nutrition and exercise instructions and strategies.   6. Class 2 severe obesity with serious comorbidity and body mass index (BMI) of 38.0 to 38.9 in adult, unspecified obesity type (HCC) Rebecca Wood is currently in the action stage of change. As such, her goal is to continue with weight loss efforts. She has agreed to the Category 3 Plan.   Exercise goals: As is  Behavioral modification strategies: increasing lean protein intake, decreasing simple carbohydrates, meal planning and cooking strategies, keeping healthy foods in the home, better snacking choices, avoiding temptations and planning for success.  Rebecca Wood has agreed to follow-up with our clinic in 2 weeks with APP or me. She was informed of the importance of frequent follow-up visits to maximize her success with intensive lifestyle modifications for her multiple health conditions.   Objective:   Blood pressure 138/70, pulse 80, temperature 97.9 F (36.6 C), height 5\' 3"  (1.6 m), weight 215 lb (97.5 kg), SpO2 97 %. Body mass index is 38.09 kg/m.  General: Cooperative, alert, well developed, in no acute distress. HEENT: Conjunctivae and lids unremarkable. Cardiovascular: Regular rhythm.  Lungs: Normal work of breathing. Neurologic: No focal deficits.   Lab Results  Component  Value Date   CREATININE 0.64 10/20/2020   BUN 7 10/20/2020   NA 137 10/20/2020   K 4.2 10/20/2020   CL 98 10/20/2020   CO2 21 10/20/2020   Lab Results  Component Value Date   ALT 77 (H) 10/20/2020   AST 83 (H) 10/20/2020   ALKPHOS 101 10/20/2020   BILITOT 1.0 10/20/2020   Lab Results  Component Value Date   HGBA1C 5.3 10/20/2020   Lab Results  Component Value Date   INSULIN 5.7 10/20/2020   Lab Results  Component Value Date   TSH 1.870 10/20/2020   Lab Results  Component Value Date   CHOL 208 (H) 10/20/2020   HDL 89 10/20/2020   LDLCALC 97 10/20/2020   TRIG 129 10/20/2020   CHOLHDL 2.3 10/20/2020   Lab Results  Component Value Date   WBC 7.9 10/20/2020   HGB 14.9 10/20/2020   HCT 43.5 10/20/2020   MCV 90 10/20/2020   PLT 418 10/20/2020   No results found for: IRON, TIBC, FERRITIN  Attestation Statements:   Reviewed by clinician on day of visit:  allergies, medications, problem list, medical history, surgical history, family history, social history, and previous encounter notes.  Rebecca Wood, am acting as Energy manager for [employer], DO.  I have reviewed the above documentation for accuracy and completeness, and I agree with the above. Rebecca Wood, D.O.  The 21st Century Cures Act was signed into law in 2016 which includes the topic of electronic health records.  This provides immediate access to information in MyChart.  This includes consultation notes, operative notes, office notes, lab results and pathology reports.  If you have any questions about what you read please let us know at your next visit so we can discuss your concerns and take corrective action if need be.  We are right here with you.

## 2020-11-15 ENCOUNTER — Ambulatory Visit (INDEPENDENT_AMBULATORY_CARE_PROVIDER_SITE_OTHER): Payer: Self-pay | Admitting: Family Medicine

## 2020-11-17 ENCOUNTER — Other Ambulatory Visit: Payer: Self-pay

## 2020-11-17 ENCOUNTER — Encounter (INDEPENDENT_AMBULATORY_CARE_PROVIDER_SITE_OTHER): Payer: Self-pay | Admitting: Family Medicine

## 2020-11-17 ENCOUNTER — Ambulatory Visit (INDEPENDENT_AMBULATORY_CARE_PROVIDER_SITE_OTHER): Payer: BC Managed Care – PPO | Admitting: Family Medicine

## 2020-11-17 VITALS — BP 116/73 | HR 78 | Temp 97.7°F | Ht 63.0 in | Wt 207.0 lb

## 2020-11-17 DIAGNOSIS — F419 Anxiety disorder, unspecified: Secondary | ICD-10-CM | POA: Diagnosis not present

## 2020-11-17 DIAGNOSIS — Z6836 Body mass index (BMI) 36.0-36.9, adult: Secondary | ICD-10-CM

## 2020-11-17 DIAGNOSIS — G4709 Other insomnia: Secondary | ICD-10-CM

## 2020-11-17 DIAGNOSIS — Z9189 Other specified personal risk factors, not elsewhere classified: Secondary | ICD-10-CM

## 2020-11-22 NOTE — Progress Notes (Signed)
Chief Complaint:   OBESITY Rebecca Wood is here to discuss her progress with her obesity treatment plan along with follow-up of her obesity related diagnoses.   Today's visit was #: 3 Starting weight: 218 lbs Starting date: 10/20/2020 Today's weight: 207 lbs Today's date: 11/17/2020 Total lbs lost to date: 11 lbs Body mass index is 36.67 kg/m.  Total weight loss percentage to date: -5.05%  Interim History: Rebecca Wood is here for a follow up office visit.  she is following the meal plan without concern or issues.  Patient's meal and food recall appears to be accurate and consistent with what is on the plan.  When on plan, her hunger and cravings are well controlled.    Nutrition Plan: Category 3 Plan for 95% of the time. Activity: Walking / Stationary bike for 20-40 minutes 7 times per week.  Assessment/Plan:  No orders of the defined types were placed in this encounter.  There are no discontinued medications.   No orders of the defined types were placed in this encounter.    1. Other insomnia This is poorly controlled.  Rebecca Wood is having a difficult time sleeping lately.  Acute in nature.  Plan:  - possible meds/ med classes reviewed with patient that are most commonly used for insomnia/ sleep difficulties  - risks/ benefits of meds (Beer's list meds reviewed when applicable to age appropriate patient's and I discussed increased risks with these particular medications)  - patient and I, together decided on current treatment plan- Recommend STARTING melatonin 3 mg and use up to 10 mg at bedtime.   - discussed proper sleep hygiene and importance of recreating these habits each and every night  - encouraged to increase activity/ exercise and avoid sleeping during the day  - will follow up on condition and treatment plan closely in conjunction with pt's PCP  -  Extensive discussion with patient regarding blue light, avoid caffeine after 3 pm, sleep hygiene, black out blinds,  etc.   2. Anxious mood Mood stable.  No need for medication.  Plan:  Extensive counseling done.  Recommend CALM app, medication/prayer.  Increase exercise.  Will follow.   3. At risk for anxiety Rebecca Wood was given approximately 11 minutes of anxiety risk counseling today. The patient has risk factors for this condition.  We discussed the importance of a healthy work/life balance, a healthy relationship with food, and a good support system.  We discussed various strategies to help cope with these emotions as well.  I recommended counseling, meditation / prayer, healthy eating habits, sleep hygiene and exercising to help manage these feelings.    4. Class 2 severe obesity with serious comorbidity and body mass index (BMI) of 36.0 to 36.9 in adult, unspecified obesity type (HCC)  Course: Rebecca Wood is currently in the action stage of change. As such, her goal is to continue with weight loss efforts.   Nutrition goals: She has agreed to the Category 3 Plan.   Exercise goals: As is.  Behavioral modification strategies: increasing lean protein intake, meal planning and cooking strategies, keeping healthy foods in the home and planning for success.  Rebecca Wood has agreed to follow-up with our clinic in 2 weeks. She was informed of the importance of frequent follow-up visits to maximize her success with intensive lifestyle modifications for her multiple health conditions.   Objective:   Blood pressure 116/73, pulse 78, temperature 97.7 F (36.5 C), height 5\' 3"  (1.6 m), weight 207 lb (93.9 kg), SpO2 96 %.  Body mass index is 36.67 kg/m.  General: Cooperative, alert, well developed, in no acute distress. HEENT: Conjunctivae and lids unremarkable. Cardiovascular: Regular rhythm.  Lungs: Normal work of breathing. Neurologic: No focal deficits.   Lab Results  Component Value Date   CREATININE 0.64 10/20/2020   BUN 7 10/20/2020   NA 137 10/20/2020   K 4.2 10/20/2020   CL 98 10/20/2020   CO2 21  10/20/2020   Lab Results  Component Value Date   ALT 77 (H) 10/20/2020   AST 83 (H) 10/20/2020   ALKPHOS 101 10/20/2020   BILITOT 1.0 10/20/2020   Lab Results  Component Value Date   HGBA1C 5.3 10/20/2020   Lab Results  Component Value Date   INSULIN 5.7 10/20/2020   Lab Results  Component Value Date   TSH 1.870 10/20/2020   Lab Results  Component Value Date   CHOL 208 (H) 10/20/2020   HDL 89 10/20/2020   LDLCALC 97 10/20/2020   TRIG 129 10/20/2020   CHOLHDL 2.3 10/20/2020   Lab Results  Component Value Date   WBC 7.9 10/20/2020   HGB 14.9 10/20/2020   HCT 43.5 10/20/2020   MCV 90 10/20/2020   PLT 418 10/20/2020   Attestation Statements:   Reviewed by clinician on day of visit: allergies, medications, problem list, medical history, surgical history, family history, social history, and previous encounter notes.  I, Insurance claims handler, CMA, am acting as Energy manager for [employer], DO.  I have reviewed the above documentation for accuracy and completeness, and I agree with the above. Carlye Grippe, D.O.  The 21st Century Cures Act was signed into law in 2016 which includes the topic of electronic health records.  This provides immediate access to information in MyChart.  This includes consultation notes, operative notes, office notes, lab results and pathology reports.  If you have any questions about what you read please let us know at your next visit so we can discuss your concerns and take corrective action if need be.  We are right here with you.

## 2020-11-30 ENCOUNTER — Other Ambulatory Visit (INDEPENDENT_AMBULATORY_CARE_PROVIDER_SITE_OTHER): Payer: Self-pay

## 2020-12-01 ENCOUNTER — Other Ambulatory Visit: Payer: Self-pay

## 2020-12-01 ENCOUNTER — Encounter (INDEPENDENT_AMBULATORY_CARE_PROVIDER_SITE_OTHER): Payer: Self-pay | Admitting: Family Medicine

## 2020-12-01 ENCOUNTER — Ambulatory Visit (INDEPENDENT_AMBULATORY_CARE_PROVIDER_SITE_OTHER): Payer: BC Managed Care – PPO | Admitting: Family Medicine

## 2020-12-01 VITALS — BP 115/72 | HR 66 | Temp 97.6°F | Ht 63.0 in | Wt 204.0 lb

## 2020-12-01 DIAGNOSIS — Z9884 Bariatric surgery status: Secondary | ICD-10-CM

## 2020-12-01 DIAGNOSIS — Z6836 Body mass index (BMI) 36.0-36.9, adult: Secondary | ICD-10-CM

## 2020-12-05 ENCOUNTER — Encounter (INDEPENDENT_AMBULATORY_CARE_PROVIDER_SITE_OTHER): Payer: Self-pay | Admitting: Family Medicine

## 2020-12-05 NOTE — Progress Notes (Signed)
Chief Complaint:   OBESITY Rebecca Wood is here to discuss her progress with her obesity treatment plan along with follow-up of her obesity related diagnoses. Rebecca Wood is on the Category 3 Plan and states she is following her eating plan approximately 85% of the time. Rebecca Wood states she is walking/biking for 20-40 minutes 6 times per week.  Today's visit was #: 4 Starting weight: 218 lbs Starting date: 10/20/2020 Today's weight: 204 lbs Today's date: 12/01/2020 Total lbs lost to date: 14 lbs Total lbs lost since last in-office visit: 3 lbs  Interim History: Rebecca Wood is doing very well on our plan. She is down 3 lbs today and 14 lbs total since 10/20/20.  She would like some variety with breakfast.  She is adhering to the plan very well.  Her husband is working on weight loss also.  Her low weight was 135 pounds after weight loss surgery.  Subjective:   1. S/P gastric bypass She had gastric bypass in 2006.  High weight before surgery was 268 pounds and low weight was 135 pounds.  She still has some restriction with portion size. She admits to not following the meal plan as she should have after surgery.   Assessment/Plan:   1. S/P gastric bypass Discussed better habits and the importance of protein intake for weight loss and maintenance.   2. Class 2 severe obesity with serious comorbidity and body mass index (BMI) of 36.0 to 36.9 in adult, unspecified obesity type (HCC)  Rebecca Wood is currently in the action stage of change. As such, her goal is to continue with weight loss efforts. She has agreed to the Category 3 Plan and keeping a food journal and adhering to recommended goals of 250-350 calories and 25 grams of protein at breakfast.   Exercise goals: As is.  Behavioral modification strategies: meal planning and cooking strategies and better snacking choices.  Rebecca Wood has agreed to follow-up with our clinic in 2 weeks.   Objective:   Blood pressure 115/72, pulse 66, temperature 97.6 F (36.4  C), height 5\' 3"  (1.6 m), weight 204 lb (92.5 kg), SpO2 98 %. Body mass index is 36.14 kg/m.  General: Cooperative, alert, well developed, in no acute distress. HEENT: Conjunctivae and lids unremarkable. Cardiovascular: Regular rhythm.  Lungs: Normal work of breathing. Neurologic: No focal deficits.   Lab Results  Component Value Date   CREATININE 0.64 10/20/2020   BUN 7 10/20/2020   NA 137 10/20/2020   K 4.2 10/20/2020   CL 98 10/20/2020   CO2 21 10/20/2020   Lab Results  Component Value Date   ALT 77 (H) 10/20/2020   AST 83 (H) 10/20/2020   ALKPHOS 101 10/20/2020   BILITOT 1.0 10/20/2020   Lab Results  Component Value Date   HGBA1C 5.3 10/20/2020   Lab Results  Component Value Date   INSULIN 5.7 10/20/2020   Lab Results  Component Value Date   TSH 1.870 10/20/2020   Lab Results  Component Value Date   CHOL 208 (H) 10/20/2020   HDL 89 10/20/2020   LDLCALC 97 10/20/2020   TRIG 129 10/20/2020   CHOLHDL 2.3 10/20/2020   Lab Results  Component Value Date   WBC 7.9 10/20/2020   HGB 14.9 10/20/2020   HCT 43.5 10/20/2020   MCV 90 10/20/2020   PLT 418 10/20/2020   Attestation Statements:   Reviewed by clinician on day of visit: allergies, medications, problem list, medical history, surgical history, family history, social history, and previous encounter  notes.  I, Insurance claims handler, CMA, am acting as Energy manager for [place], FNP.  I have reviewed the above documentation for accuracy and completeness, and I agree with the above. -  Jesse Sans, FNP

## 2020-12-06 DIAGNOSIS — F411 Generalized anxiety disorder: Secondary | ICD-10-CM | POA: Diagnosis not present

## 2020-12-06 DIAGNOSIS — F329 Major depressive disorder, single episode, unspecified: Secondary | ICD-10-CM | POA: Diagnosis not present

## 2020-12-13 DIAGNOSIS — Z01419 Encounter for gynecological examination (general) (routine) without abnormal findings: Secondary | ICD-10-CM | POA: Diagnosis not present

## 2020-12-13 DIAGNOSIS — Z3040 Encounter for surveillance of contraceptives, unspecified: Secondary | ICD-10-CM | POA: Diagnosis not present

## 2020-12-13 DIAGNOSIS — Z1231 Encounter for screening mammogram for malignant neoplasm of breast: Secondary | ICD-10-CM | POA: Diagnosis not present

## 2020-12-13 DIAGNOSIS — Z1211 Encounter for screening for malignant neoplasm of colon: Secondary | ICD-10-CM | POA: Diagnosis not present

## 2020-12-15 ENCOUNTER — Other Ambulatory Visit: Payer: Self-pay

## 2020-12-15 ENCOUNTER — Encounter (INDEPENDENT_AMBULATORY_CARE_PROVIDER_SITE_OTHER): Payer: Self-pay | Admitting: Family Medicine

## 2020-12-15 ENCOUNTER — Ambulatory Visit (INDEPENDENT_AMBULATORY_CARE_PROVIDER_SITE_OTHER): Payer: BC Managed Care – PPO | Admitting: Family Medicine

## 2020-12-15 VITALS — BP 102/68 | HR 74 | Temp 98.1°F | Ht 63.0 in | Wt 197.0 lb

## 2020-12-15 DIAGNOSIS — Z6834 Body mass index (BMI) 34.0-34.9, adult: Secondary | ICD-10-CM

## 2020-12-15 DIAGNOSIS — E8881 Metabolic syndrome: Secondary | ICD-10-CM

## 2020-12-15 DIAGNOSIS — Z9189 Other specified personal risk factors, not elsewhere classified: Secondary | ICD-10-CM

## 2020-12-15 DIAGNOSIS — E669 Obesity, unspecified: Secondary | ICD-10-CM | POA: Diagnosis not present

## 2020-12-15 DIAGNOSIS — E88819 Insulin resistance, unspecified: Secondary | ICD-10-CM

## 2020-12-15 DIAGNOSIS — E559 Vitamin D deficiency, unspecified: Secondary | ICD-10-CM | POA: Diagnosis not present

## 2020-12-15 MED ORDER — VITAMIN D (ERGOCALCIFEROL) 1.25 MG (50000 UNIT) PO CAPS: 50000.0000 [IU] | ORAL_CAPSULE | ORAL | 0 refills | Status: DC

## 2020-12-19 NOTE — Progress Notes (Signed)
Chief Complaint:   OBESITY Rebecca Wood is here to discuss her progress with her obesity treatment plan along with follow-up of her obesity related diagnoses. Rebecca Wood is on the Category 3 Plan and keeping a food journal and adhering to recommended goals of 250-350 calories and 25 grams of protein at breakfast daily and states she is following her eating plan approximately 75-80% of the time. Rebecca Wood states she is doing cardio for 20-30 minutes 5-6 times per week.  Today's visit was #: 5 Starting weight: 218 lbs Starting date: 10/20/2020 Today's weight: 197 lbs Today's date: 12/15/2020 Total lbs lost to date: 21 Total lbs lost since last in-office visit: 7  Interim History: Rebecca Wood notes some hunger and cravings in the afternoon, but she has done well over the past few weeks. She is down 7 lbs today.  Subjective:   1. Vitamin D deficiency Rebecca Wood ;ast Vit D level was low at 27.4. She is on weekly Vit D.  2. Insulin resistance Rebecca Wood notes polyphagia in the afternoon, and she is not on metformin. She continues to work on diet and exercise to decrease her risk of diabetes.  Lab Results  Component Value Date   INSULIN 5.7 10/20/2020   Lab Results  Component Value Date   HGBA1C 5.3 10/20/2020   3. At risk for diabetes mellitus Rebecca Wood is at higher than average risk for developing diabetes due to obesity and insulin resistance.   Assessment/Plan:   1. Vitamin D deficiency We will refill prescription Vitamin D for 1 month. Rebecca Wood will follow-up for routine testing of Vitamin D, at least 2-3 times per year to avoid over-replacement.  - Vitamin D, Ergocalciferol, (DRISDOL) 1.25 MG (50000 UNIT) CAPS capsule; Take 1 capsule (50,000 Units total) by mouth every 7 (seven) days.  Dispense: 4 capsule; Refill: 0  2. Insulin resistance We discussed metformin, and she may consider it at her next office visit.   3. At risk for diabetes mellitus Rebecca Wood was given approximately 15 minutes of diabetes  education and counseling today. We discussed intensive lifestyle modifications today with an emphasis on weight loss as well as increasing exercise and decreasing simple carbohydrates in her diet. We also reviewed medication options with an emphasis on risk versus benefit of those discussed.   Repetitive spaced learning was employed today to elicit superior memory formation and behavioral change.  4. Class 1 obesity with serious comorbidity and body mass index (BMI) of 34.0 to 34.9 in adult, unspecified obesity type Rebecca Wood is currently in the action stage of change. As such, her goal is to continue with weight loss efforts. She has agreed to the Category 3 Plan.   Handout was given today: Smart Fruit.  Exercise goals: As is.  Behavioral modification strategies: better snacking choices.  Rebecca Wood has agreed to follow-up with our clinic in 2 weeks.   Objective:   Blood pressure 102/68, pulse 74, temperature 98.1 F (36.7 C), height 5\' 3"  (1.6 m), weight 197 lb (89.4 kg), SpO2 98 %. Body mass index is 34.9 kg/m.  General: Cooperative, alert, well developed, in no acute distress. HEENT: Conjunctivae and lids unremarkable. Cardiovascular: Regular rhythm.  Lungs: Normal work of breathing. Neurologic: No focal deficits.   Lab Results  Component Value Date   CREATININE 0.64 10/20/2020   BUN 7 10/20/2020   NA 137 10/20/2020   K 4.2 10/20/2020   CL 98 10/20/2020   CO2 21 10/20/2020   Lab Results  Component Value Date   ALT 77 (H)  10/20/2020   AST 83 (H) 10/20/2020   ALKPHOS 101 10/20/2020   BILITOT 1.0 10/20/2020   Lab Results  Component Value Date   HGBA1C 5.3 10/20/2020   Lab Results  Component Value Date   INSULIN 5.7 10/20/2020   Lab Results  Component Value Date   TSH 1.870 10/20/2020   Lab Results  Component Value Date   CHOL 208 (H) 10/20/2020   HDL 89 10/20/2020   LDLCALC 97 10/20/2020   TRIG 129 10/20/2020   CHOLHDL 2.3 10/20/2020   Lab Results  Component  Value Date   WBC 7.9 10/20/2020   HGB 14.9 10/20/2020   HCT 43.5 10/20/2020   MCV 90 10/20/2020   PLT 418 10/20/2020   No results found for: IRON, TIBC, FERRITIN   Attestation Statements:   Reviewed by clinician on day of visit: allergies, medications, problem list, medical history, surgical history, family history, social history, and previous encounter notes.   Rebecca Wood, am acting as Energy manager for [place], FNP-C.  I have reviewed the above documentation for accuracy and completeness, and I agree with the above. -  Jesse Sans, FNP

## 2020-12-20 ENCOUNTER — Encounter (INDEPENDENT_AMBULATORY_CARE_PROVIDER_SITE_OTHER): Payer: Self-pay | Admitting: Family Medicine

## 2020-12-20 DIAGNOSIS — E8881 Metabolic syndrome: Secondary | ICD-10-CM | POA: Insufficient documentation

## 2020-12-20 DIAGNOSIS — E88819 Insulin resistance, unspecified: Secondary | ICD-10-CM | POA: Insufficient documentation

## 2020-12-27 DIAGNOSIS — H04123 Dry eye syndrome of bilateral lacrimal glands: Secondary | ICD-10-CM | POA: Diagnosis not present

## 2020-12-27 DIAGNOSIS — H43813 Vitreous degeneration, bilateral: Secondary | ICD-10-CM | POA: Diagnosis not present

## 2020-12-27 DIAGNOSIS — H1045 Other chronic allergic conjunctivitis: Secondary | ICD-10-CM | POA: Diagnosis not present

## 2020-12-27 DIAGNOSIS — H2513 Age-related nuclear cataract, bilateral: Secondary | ICD-10-CM | POA: Diagnosis not present

## 2020-12-29 ENCOUNTER — Encounter (INDEPENDENT_AMBULATORY_CARE_PROVIDER_SITE_OTHER): Payer: Self-pay | Admitting: Family Medicine

## 2020-12-29 ENCOUNTER — Ambulatory Visit (INDEPENDENT_AMBULATORY_CARE_PROVIDER_SITE_OTHER): Payer: BC Managed Care – PPO | Admitting: Family Medicine

## 2020-12-29 ENCOUNTER — Other Ambulatory Visit: Payer: Self-pay

## 2020-12-29 VITALS — BP 114/71 | HR 69 | Temp 98.0°F | Ht 63.0 in | Wt 199.0 lb

## 2020-12-29 DIAGNOSIS — Z6835 Body mass index (BMI) 35.0-35.9, adult: Secondary | ICD-10-CM

## 2020-12-29 DIAGNOSIS — R632 Polyphagia: Secondary | ICD-10-CM | POA: Diagnosis not present

## 2021-01-03 NOTE — Progress Notes (Signed)
     Chief Complaint:   OBESITY Rebecca Wood is here to discuss her progress with her obesity treatment plan along with follow-up of her obesity related diagnoses. Rebecca Wood is on the Category 3 Plan and states she is following her eating plan approximately 80% of the time. Rebecca Wood states she is biking and walking for 20 minutes 5 times per week.  Today's visit was #: 6 Starting weight: 218 lbs Starting date: 10/20/2020 Today's weight: 199 lbs Today's date: 12/29/2020 Total lbs lost to date: 19 lbs Total lbs lost since last in-office visit: +2  Interim History: Rebecca Wood notes she has eaten out more the past few weeks.  She has also been exercising less.  She had a baked potato and salad and grilled chicken when eating out.  Subjective:   1. Polyphagia Notes hunger/cravings in afternoon.  She usually has fruit as a snack.  Assessment/Plan:   1. Polyphagia Taking protein as a snack rather than fruit or other carb.  2. Class 2 severe obesity with serious comorbidity and body mass index (BMI) of 35.0 to 35.9 in adult, unspecified obesity type (HCC)  Rebecca Wood is currently in the action stage of change. As such, her goal is to continue with weight loss efforts. She has agreed to the Category 3 Plan and keeping a food journal and adhering to recommended goals of 1500-1600 calories and 100 grams of protein.   Encouragement provided.  She will see if [facility] and/or Saxenda are covered.  Discussed these medications in depth.  Exercise goals: Increase exercise frequency.  Behavioral modification strategies: decreasing simple carbohydrates and planning for success.  Rebecca Wood has agreed to follow-up with our clinic in 2 weeks.   Objective:   Blood pressure 114/71, pulse 69, temperature 98 F (36.7 C), height 5\' 3"  (1.6 m), weight 199 lb (90.3 kg), SpO2 97 %. Body mass index is 35.25 kg/m.  General: Cooperative, alert, well developed, in no acute distress. HEENT: Conjunctivae and lids  unremarkable. Cardiovascular: Regular rhythm.  Lungs: Normal work of breathing. Neurologic: No focal deficits.   Lab Results  Component Value Date   CREATININE 0.64 10/20/2020   BUN 7 10/20/2020   NA 137 10/20/2020   K 4.2 10/20/2020   CL 98 10/20/2020   CO2 21 10/20/2020   Lab Results  Component Value Date   ALT 77 (H) 10/20/2020   AST 83 (H) 10/20/2020   ALKPHOS 101 10/20/2020   BILITOT 1.0 10/20/2020   Lab Results  Component Value Date   HGBA1C 5.3 10/20/2020   Lab Results  Component Value Date   INSULIN 5.7 10/20/2020   Lab Results  Component Value Date   TSH 1.870 10/20/2020   Lab Results  Component Value Date   CHOL 208 (H) 10/20/2020   HDL 89 10/20/2020   LDLCALC 97 10/20/2020   TRIG 129 10/20/2020   CHOLHDL 2.3 10/20/2020   Lab Results  Component Value Date   WBC 7.9 10/20/2020   HGB 14.9 10/20/2020   HCT 43.5 10/20/2020   MCV 90 10/20/2020   PLT 418 10/20/2020   Attestation Statements:   Reviewed by clinician on day of visit: allergies, medications, problem list, medical history, surgical history, family history, social history, and previous encounter notes.  I, 12/18/2020, CMA, am acting as Insurance claims handler for Energy manager, FNP.  I have reviewed the above documentation for accuracy and completeness, and I agree with the above. -  [place], FNP

## 2021-01-04 ENCOUNTER — Encounter (INDEPENDENT_AMBULATORY_CARE_PROVIDER_SITE_OTHER): Payer: Self-pay | Admitting: Family Medicine

## 2021-01-12 ENCOUNTER — Ambulatory Visit (INDEPENDENT_AMBULATORY_CARE_PROVIDER_SITE_OTHER): Payer: BC Managed Care – PPO | Admitting: Family Medicine

## 2021-01-12 ENCOUNTER — Encounter (INDEPENDENT_AMBULATORY_CARE_PROVIDER_SITE_OTHER): Payer: Self-pay | Admitting: Family Medicine

## 2021-01-12 ENCOUNTER — Other Ambulatory Visit: Payer: Self-pay

## 2021-01-12 VITALS — BP 123/73 | HR 67 | Temp 97.6°F | Ht 63.0 in | Wt 194.0 lb

## 2021-01-12 DIAGNOSIS — I10 Essential (primary) hypertension: Secondary | ICD-10-CM

## 2021-01-12 DIAGNOSIS — E8881 Metabolic syndrome: Secondary | ICD-10-CM | POA: Diagnosis not present

## 2021-01-12 DIAGNOSIS — Z6838 Body mass index (BMI) 38.0-38.9, adult: Secondary | ICD-10-CM

## 2021-01-12 DIAGNOSIS — E88819 Insulin resistance, unspecified: Secondary | ICD-10-CM

## 2021-01-12 MED ORDER — OZEMPIC (0.25 OR 0.5 MG/DOSE) 2 MG/1.5ML ~~LOC~~ SOPN
0.2500 mg | PEN_INJECTOR | SUBCUTANEOUS | 0 refills | Status: DC
Start: 2021-01-12 — End: 2021-03-08

## 2021-01-16 ENCOUNTER — Encounter (INDEPENDENT_AMBULATORY_CARE_PROVIDER_SITE_OTHER): Payer: Self-pay

## 2021-01-16 NOTE — Progress Notes (Addendum)
Chief Complaint:   OBESITY Rebecca Wood is here to discuss her progress with her obesity treatment plan along with follow-up of her obesity related diagnoses. Rebecca Wood is on the Category 3 Plan and keeping a food journal and adhering to recommended goals of 1500-1600 calories and 100 g protein and states she is following her eating plan approximately 30% of the time. Rebecca Wood states she is doing 0 minutes 0 times per week.  Today's visit was #: 7 Starting weight: 218 lbs Starting date: 10/20/2020 Today's weight: 194 lbs Today's date: 01/12/2021 Total lbs lost to date: 24 lbs Total lbs lost since last in-office visit: 5 lbs  Interim History: Rebecca Wood went out of town for a weekend and was off plan but still lost 5 lbs. She is now back on plan.  She feels that she needs to exercise and has not been consistent with this.  Subjective:   1. Insulin resistance Anti-obesity meds are not covered by [employer]. Her last fasting insulin was 5.7.   Lab Results  Component Value Date   INSULIN 5.7 10/20/2020   Lab Results  Component Value Date   HGBA1C 5.3 10/20/2020    2. Essential hypertension Rebecca Wood's BP is well controlled on Norvasc 10 mg and losartan-HCTZ 100/25 mg.   BP Readings from Last 3 Encounters:  01/12/21 123/73  12/29/20 114/71  12/15/20 102/68    Assessment/Plan:   1. Insulin resistance New RX:  - Semaglutide,0.25 or 0.5MG /DOS, (OZEMPIC, 0.25 OR 0.5 MG/DOSE,) 2 MG/1.5ML SOPN; Inject 0.25 mg into the skin once a week.  Dispense: 1.5 mL; Refill: 0 She denies personal or family history of thyroid cancer, history of pancreatitis, or current cholelithiasis. She was informed of side effects.   2. Essential hypertension Continue current treatment plan.  3. Obesity: BMI 34 Rebecca Wood is currently in the action stage of change. As such, her goal is to continue with weight loss efforts. She has agreed to the Category 3 Plan and keeping a food journal and adhering to recommended  goals of 1500-1600 calories and 100 g protein.   Exercise goals: All adults should avoid inactivity. Some physical activity is better than none, and adults who participate in any amount of physical activity gain some health benefits. Add resistance training 2 times a week.  Behavioral modification strategies: increasing lean protein intake, decreasing simple carbohydrates and keeping a strict food journal.  Rebecca Wood has agreed to follow-up with our clinic in 2 weeks.  Objective:   Blood pressure 123/73, pulse 67, temperature 97.6 F (36.4 C), height 5\' 3"  (1.6 m), weight 194 lb (88 kg), SpO2 99 %. Body mass index is 34.37 kg/m.  General: Cooperative, alert, well developed, in no acute distress. HEENT: Conjunctivae and lids unremarkable. Cardiovascular: Regular rhythm.  Lungs: Normal work of breathing. Neurologic: No focal deficits.   Lab Results  Component Value Date   CREATININE 0.64 10/20/2020   BUN 7 10/20/2020   NA 137 10/20/2020   K 4.2 10/20/2020   CL 98 10/20/2020   CO2 21 10/20/2020   Lab Results  Component Value Date   ALT 77 (H) 10/20/2020   AST 83 (H) 10/20/2020   ALKPHOS 101 10/20/2020   BILITOT 1.0 10/20/2020   Lab Results  Component Value Date   HGBA1C 5.3 10/20/2020   Lab Results  Component Value Date   INSULIN 5.7 10/20/2020   Lab Results  Component Value Date   TSH 1.870 10/20/2020   Lab Results  Component Value Date  CHOL 208 (H) 10/20/2020   HDL 89 10/20/2020   LDLCALC 97 10/20/2020   TRIG 129 10/20/2020   CHOLHDL 2.3 10/20/2020   Lab Results  Component Value Date   WBC 7.9 10/20/2020   HGB 14.9 10/20/2020   HCT 43.5 10/20/2020   MCV 90 10/20/2020   PLT 418 10/20/2020    Attestation Statements:   Reviewed by clinician on day of visit: allergies, medications, problem list, medical history, surgical history, family history, social history, and previous encounter notes.  Edmund Hilda, am acting as Energy manager for [employer], FNP.  I have reviewed the above documentation for accuracy and completeness, and I agree with the above. -  Jesse Sans, FNP

## 2021-01-17 ENCOUNTER — Encounter (INDEPENDENT_AMBULATORY_CARE_PROVIDER_SITE_OTHER): Payer: Self-pay | Admitting: Family Medicine

## 2021-01-26 ENCOUNTER — Other Ambulatory Visit: Payer: Self-pay

## 2021-01-26 ENCOUNTER — Ambulatory Visit (INDEPENDENT_AMBULATORY_CARE_PROVIDER_SITE_OTHER): Payer: BC Managed Care – PPO | Admitting: Family Medicine

## 2021-01-26 ENCOUNTER — Encounter (INDEPENDENT_AMBULATORY_CARE_PROVIDER_SITE_OTHER): Payer: Self-pay | Admitting: Family Medicine

## 2021-01-26 VITALS — BP 102/67 | HR 80 | Temp 98.0°F | Ht 63.0 in | Wt 190.0 lb

## 2021-01-26 DIAGNOSIS — Z9189 Other specified personal risk factors, not elsewhere classified: Secondary | ICD-10-CM

## 2021-01-26 DIAGNOSIS — E8881 Metabolic syndrome: Secondary | ICD-10-CM

## 2021-01-26 DIAGNOSIS — Z6838 Body mass index (BMI) 38.0-38.9, adult: Secondary | ICD-10-CM | POA: Diagnosis not present

## 2021-01-26 DIAGNOSIS — E88819 Insulin resistance, unspecified: Secondary | ICD-10-CM

## 2021-01-26 DIAGNOSIS — E559 Vitamin D deficiency, unspecified: Secondary | ICD-10-CM | POA: Diagnosis not present

## 2021-01-26 MED ORDER — VITAMIN D (ERGOCALCIFEROL) 1.25 MG (50000 UNIT) PO CAPS: 50000.0000 [IU] | ORAL_CAPSULE | ORAL | 0 refills | Status: DC

## 2021-01-26 MED ORDER — METFORMIN HCL 500 MG PO TABS: 500.0000 mg | ORAL_TABLET | Freq: Every day | ORAL | 0 refills | Status: DC

## 2021-01-31 ENCOUNTER — Encounter (INDEPENDENT_AMBULATORY_CARE_PROVIDER_SITE_OTHER): Payer: Self-pay | Admitting: Family Medicine

## 2021-01-31 NOTE — Progress Notes (Signed)
Chief Complaint:   OBESITY Rebecca Wood is here to discuss her progress with her obesity treatment plan along with follow-up of her obesity related diagnoses. Rebecca Wood is on the Category 3 Plan and keeping a food journal and adhering to recommended goals of 1500-1600 calories and 100 grams of protein and states she is following her eating plan approximately 70% of the time. Rebecca Wood states she is walking and riding her stationary bike for 30 minutes 3 times per week.  Today's visit was #: 8 Starting weight: 218 lbs Starting date: 10/20/2020 Today's weight: 190 lbs Today's date: 01/26/2021 Total lbs lost to date: 28 lbs Total lbs lost since last in-office visit: 4 lbs  Interim History: Rebecca Wood is doing very well on plan and is journaling consistently. She is meeting calorie and protein goals. She notes some hunger after lunch but is overall satisfied.     Subjective:   1. Vitamin D deficiency Rebecca Wood's Vitamin D level low at 27.4. currently on weekly Vitamin D prescription.  2. Insulin resistance Rebecca Wood notes some hunger in the afternoon. Her last fasting insulin was 5.7. GLP-1's are not covered by insurance.  Lab Results  Component Value Date   INSULIN 5.7 10/20/2020   Lab Results  Component Value Date   HGBA1C 5.3 10/20/2020   3. At risk for diabetes mellitus Rebecca Wood is at higher than average risk for developing diabetes due to insulin resistance and obesity.   Assessment/Plan:   1. Vitamin D deficiency We will refill Vitamin D 50,000 IU weekly for 1 month.  - Vitamin D, Ergocalciferol, (DRISDOL) 1.25 MG (50000 UNIT) CAPS capsule; Take 1 capsule (50,000 Units total) by mouth every 7 (seven) days.  Dispense: 4 capsule; Refill: 0  2. Insulin resistance We will refill Metformin 500 mg for 30 days  - metFORMIN (GLUCOPHAGE) 500 MG tablet; Take 1 tablet (500 mg total) by mouth daily with lunch.  Dispense: 30 tablet; Refill: 0  3. At risk for diabetes mellitus Rebecca Wood was given  approximately 15 minutes of diabetes education and counseling today. We discussed intensive lifestyle modifications today with an emphasis on weight loss as well as increasing exercise and decreasing simple carbohydrates in her diet. We also reviewed medication options with an emphasis on risk versus benefit of those discussed.   Repetitive spaced learning was employed today to elicit superior memory formation and behavioral change.  4. Obesity: current BMI 33.7 Rebecca Wood is currently in the action stage of change. As such, her goal is to continue with weight loss efforts. She has agreed to keeping a food journal and adhering to recommended goals of 1500-1600 calories and 100 grams of protein.   Exercise goals: As is.  Behavioral modification strategies: planning for success.  Rebecca Wood has agreed to follow-up with our clinic in 2 weeks. She was informed of the importance of frequent follow-up visits to maximize her success with intensive lifestyle modifications for her multiple health conditions.   Objective:   Blood pressure 102/67, pulse 80, temperature 98 F (36.7 C), height 5\' 3"  (1.6 m), weight 190 lb (86.2 kg), SpO2 96 %. Body mass index is 33.66 kg/m.  General: Cooperative, alert, well developed, in no acute distress. HEENT: Conjunctivae and lids unremarkable. Cardiovascular: Regular rhythm.  Lungs: Normal work of breathing. Neurologic: No focal deficits.   Lab Results  Component Value Date   CREATININE 0.64 10/20/2020   BUN 7 10/20/2020   NA 137 10/20/2020   K 4.2 10/20/2020   CL 98 10/20/2020  CO2 21 10/20/2020   Lab Results  Component Value Date   ALT 77 (H) 10/20/2020   AST 83 (H) 10/20/2020   ALKPHOS 101 10/20/2020   BILITOT 1.0 10/20/2020   Lab Results  Component Value Date   HGBA1C 5.3 10/20/2020   Lab Results  Component Value Date   INSULIN 5.7 10/20/2020   Lab Results  Component Value Date   TSH 1.870 10/20/2020   Lab Results  Component Value Date    CHOL 208 (H) 10/20/2020   HDL 89 10/20/2020   LDLCALC 97 10/20/2020   TRIG 129 10/20/2020   CHOLHDL 2.3 10/20/2020   Lab Results  Component Value Date   WBC 7.9 10/20/2020   HGB 14.9 10/20/2020   HCT 43.5 10/20/2020   MCV 90 10/20/2020   PLT 418 10/20/2020   Attestation Statements:   Reviewed by clinician on day of visit: allergies, medications, problem list, medical history, surgical history, family history, social history, and previous encounter notes.  Carlos Levering Friedenbach, CMA, am acting as Energy manager for [place], FNP.  I have reviewed the above documentation for accuracy and completeness, and I agree with the above. -  Jesse Sans, FNP

## 2021-02-08 ENCOUNTER — Encounter (INDEPENDENT_AMBULATORY_CARE_PROVIDER_SITE_OTHER): Payer: Self-pay | Admitting: Family Medicine

## 2021-02-08 ENCOUNTER — Ambulatory Visit (INDEPENDENT_AMBULATORY_CARE_PROVIDER_SITE_OTHER): Payer: BC Managed Care – PPO | Admitting: Family Medicine

## 2021-02-08 ENCOUNTER — Other Ambulatory Visit: Payer: Self-pay

## 2021-02-08 VITALS — BP 114/70 | HR 74 | Temp 97.9°F | Ht 63.0 in | Wt 187.0 lb

## 2021-02-08 DIAGNOSIS — E8881 Metabolic syndrome: Secondary | ICD-10-CM | POA: Diagnosis not present

## 2021-02-08 DIAGNOSIS — Z6838 Body mass index (BMI) 38.0-38.9, adult: Secondary | ICD-10-CM | POA: Diagnosis not present

## 2021-02-08 DIAGNOSIS — E88819 Insulin resistance, unspecified: Secondary | ICD-10-CM

## 2021-02-09 NOTE — Progress Notes (Signed)
Chief Complaint:   OBESITY Rebecca Wood is here to discuss her progress with her obesity treatment plan along with follow-up of her obesity related diagnoses. Rebecca Wood is on the Category 3 Plan and keeping a food journal and adhering to recommended goals of 1500-1600 calories and 100 g protein and states she is following her eating plan approximately 80% of the time. Rebecca Wood states she is not currently exercising.  Today's visit was #: 9 Starting weight: 218 lbs Starting date: 10/20/2020 Today's weight: 187 lbs Today's date: 02/08/2021 Total lbs lost to date: 31 lbs Total lbs lost since last in-office visit: 3  Interim History: Rebecca Wood says she was able to obtain Ozempic, despite it being denied by insurance. She is journaling daily and meeting her protein goal. She has done extremely well and has lost a total of 31 lbs since January.  Subjective:   1. Insulin resistance Rebecca Wood's insurance decided to cover Ozempic. She took her 1st injection 4 days ago. She noted some dizziness and overall "not feeling well". Her heat rate was elevated also. She notes a decrease in appetite.  Assessment/Plan:   1. Insulin resistance  Continue Ozempic. She would like to try another dose. Advised her to disvontnue if she continues to have elevated heart rate.   2. Obesity: current BMI 33.13 Rebecca Wood is currently in the action stage of change. As such, her goal is to continue with weight loss efforts. She has agreed to keeping a food journal and adhering to recommended goals of 1500-1600 calories and 100 g protein.   Exercise goals: All adults should avoid inactivity. Some physical activity is better than none, and adults who participate in any amount of physical activity gain some health benefits.  Behavioral modification strategies: planning for success and keeping a strict food journal.  Rebecca Wood has agreed to follow-up with our clinic in 3 weeks.   Objective:   Blood pressure 114/70, pulse 74, temperature 97.9  F (36.6 C), height 5\' 3"  (1.6 m), weight 187 lb (84.8 kg), SpO2 98 %. Body mass index is 33.13 kg/m.  General: Cooperative, alert, well developed, in no acute distress. HEENT: Conjunctivae and lids unremarkable. Cardiovascular: Regular rhythm.  Lungs: Normal work of breathing. Neurologic: No focal deficits.   Lab Results  Component Value Date   CREATININE 0.64 10/20/2020   BUN 7 10/20/2020   NA 137 10/20/2020   K 4.2 10/20/2020   CL 98 10/20/2020   CO2 21 10/20/2020   Lab Results  Component Value Date   ALT 77 (H) 10/20/2020   AST 83 (H) 10/20/2020   ALKPHOS 101 10/20/2020   BILITOT 1.0 10/20/2020   Lab Results  Component Value Date   HGBA1C 5.3 10/20/2020   Lab Results  Component Value Date   INSULIN 5.7 10/20/2020   Lab Results  Component Value Date   TSH 1.870 10/20/2020   Lab Results  Component Value Date   CHOL 208 (H) 10/20/2020   HDL 89 10/20/2020   LDLCALC 97 10/20/2020   TRIG 129 10/20/2020   CHOLHDL 2.3 10/20/2020   Lab Results  Component Value Date   WBC 7.9 10/20/2020   HGB 14.9 10/20/2020   HCT 43.5 10/20/2020   MCV 90 10/20/2020   PLT 418 10/20/2020    Attestation Statements:   Reviewed by clinician on day of visit: allergies, medications, problem list, medical history, surgical history, family history, social history, and previous encounter notes.  12/18/2020, am acting as Edmund Hilda for Energy manager, FNP.  I have reviewed the above documentation for accuracy and completeness, and I agree with the above. -  Georgianne Fick, FNP

## 2021-02-14 ENCOUNTER — Encounter (INDEPENDENT_AMBULATORY_CARE_PROVIDER_SITE_OTHER): Payer: Self-pay | Admitting: Family Medicine

## 2021-02-16 DIAGNOSIS — F329 Major depressive disorder, single episode, unspecified: Secondary | ICD-10-CM | POA: Diagnosis not present

## 2021-02-16 DIAGNOSIS — F411 Generalized anxiety disorder: Secondary | ICD-10-CM | POA: Diagnosis not present

## 2021-02-22 ENCOUNTER — Ambulatory Visit (INDEPENDENT_AMBULATORY_CARE_PROVIDER_SITE_OTHER): Payer: BC Managed Care – PPO | Admitting: Family Medicine

## 2021-02-28 ENCOUNTER — Other Ambulatory Visit: Payer: Self-pay | Admitting: Family Medicine

## 2021-02-28 ENCOUNTER — Ambulatory Visit
Admission: RE | Admit: 2021-02-28 | Discharge: 2021-02-28 | Disposition: A | Discharge status: Home / Self Care | Payer: BC Managed Care – PPO | Source: Ambulatory Visit | Attending: Family Medicine | Admitting: Family Medicine

## 2021-02-28 DIAGNOSIS — Z0000 Encounter for general adult medical examination without abnormal findings: Secondary | ICD-10-CM | POA: Diagnosis not present

## 2021-02-28 DIAGNOSIS — M160 Bilateral primary osteoarthritis of hip: Secondary | ICD-10-CM | POA: Diagnosis not present

## 2021-02-28 DIAGNOSIS — M545 Low back pain, unspecified: Secondary | ICD-10-CM | POA: Diagnosis not present

## 2021-02-28 DIAGNOSIS — M5440 Lumbago with sciatica, unspecified side: Secondary | ICD-10-CM

## 2021-02-28 DIAGNOSIS — M25751 Osteophyte, right hip: Secondary | ICD-10-CM | POA: Diagnosis not present

## 2021-03-01 DIAGNOSIS — Z13220 Encounter for screening for lipoid disorders: Secondary | ICD-10-CM | POA: Diagnosis not present

## 2021-03-01 DIAGNOSIS — I10 Essential (primary) hypertension: Secondary | ICD-10-CM | POA: Diagnosis not present

## 2021-03-08 ENCOUNTER — Ambulatory Visit (INDEPENDENT_AMBULATORY_CARE_PROVIDER_SITE_OTHER): Payer: BC Managed Care – PPO | Admitting: Family Medicine

## 2021-03-08 ENCOUNTER — Other Ambulatory Visit: Payer: Self-pay

## 2021-03-08 ENCOUNTER — Encounter (INDEPENDENT_AMBULATORY_CARE_PROVIDER_SITE_OTHER): Payer: Self-pay | Admitting: Family Medicine

## 2021-03-08 VITALS — BP 100/66 | HR 69 | Temp 98.1°F | Ht 63.0 in | Wt 183.0 lb

## 2021-03-08 DIAGNOSIS — Z6838 Body mass index (BMI) 38.0-38.9, adult: Secondary | ICD-10-CM | POA: Diagnosis not present

## 2021-03-08 DIAGNOSIS — E559 Vitamin D deficiency, unspecified: Secondary | ICD-10-CM

## 2021-03-08 DIAGNOSIS — Z9189 Other specified personal risk factors, not elsewhere classified: Secondary | ICD-10-CM | POA: Diagnosis not present

## 2021-03-08 DIAGNOSIS — E88819 Insulin resistance, unspecified: Secondary | ICD-10-CM

## 2021-03-08 DIAGNOSIS — E8881 Metabolic syndrome: Secondary | ICD-10-CM | POA: Diagnosis not present

## 2021-03-08 MED ORDER — VITAMIN D (ERGOCALCIFEROL) 1.25 MG (50000 UNIT) PO CAPS: 50000.0000 [IU] | ORAL_CAPSULE | ORAL | 0 refills | Status: DC

## 2021-03-08 MED ORDER — OZEMPIC (0.25 OR 0.5 MG/DOSE) 2 MG/1.5ML ~~LOC~~ SOPN: 0.5000 mg | PEN_INJECTOR | SUBCUTANEOUS | 0 refills | Status: DC

## 2021-03-09 LAB — VITAMIN D 25 HYDROXY (VIT D DEFICIENCY, FRACTURES): Vit D, 25-Hydroxy: 34.8 ng/mL (ref 30.0–100.0)

## 2021-03-09 NOTE — Progress Notes (Signed)
Chief Complaint:   OBESITY Rebecca Wood is here to discuss her progress with her obesity treatment plan along with follow-up of her obesity related diagnoses. Rebecca Wood is on keeping a food journal and adhering to recommended goals of 1500-1600 calories and 100 grams of protein daily and states she is following her eating plan approximately 70% of the time. Rebecca Wood states she is doing 0 minutes 0 times per week.  Today's visit was #: 10 Starting weight: 218 lbs Starting date: 10/20/2020 Today's weight: 183 lbs Today's date: 03/08/2021 Total lbs lost to date: 35 Total lbs lost since last in-office visit: 4  Interim History: Rebecca Wood had recent labs and her AST and ALT are both reduced to within normal limits. They were elevated when she started with [place] in January 2022. She is doing well on the plan and she is down 35 lbs overall. She was recently diagnosed with osteoarthritis in both hips and back which causes significant pain.  Subjective:   1. Insulin resistance Rebecca Wood is on Ozempic 0.25 mg weekly, and she is tolerating it well. Her A1c was 5.3 on 03/01/2021, and she notes polyphagia.   Lab Results  Component Value Date   INSULIN 5.7 10/20/2020   Lab Results  Component Value Date   HGBA1C 5.3 10/20/2020   2. Vitamin D deficiency Kesa's Vit D level is low at 27.4. She is on weekly prescription Vit D.  3. At risk for side effect of medication Rebecca Wood is at risk for drug side effects due to starting Ozempic.  Assessment/Plan:   1. Insulin resistance Rebecca Wood agreed to increase Ozempic to 0.50 mg weekly with no refills.  - Semaglutide,0.25 or 0.5MG /DOS, (OZEMPIC, 0.25 OR 0.5 MG/DOSE,) 2 MG/1.5ML SOPN; Inject 0.5 mg into the skin once a week.  Dispense: 1.5 mL; Refill: 0  2. Vitamin D deficiency . We will check labs today, and we will refill prescription Vitamin D for 1 month.Rebecca Wood will follow-up for routine testing of Vitamin D, at least 2-3 times per year to avoid over-replacement.  - Vitamin  D, Ergocalciferol, (DRISDOL) 1.25 MG (50000 UNIT) CAPS capsule; Take 1 capsule (50,000 Units total) by mouth every 7 (seven) days.  Dispense: 4 capsule; Refill: 0 - VITAMIN D 25 Hydroxy (Vit-D Deficiency, Fractures)  3. At risk for side effect of medication Rebecca Wood was given approximately 15 minutes of drug side effect counseling today.  We discussed side effect possibility and risk versus benefits. Rebecca Wood agreed to the medication and will contact this office if these side effects are intolerable.  Repetitive spaced learning was employed today to elicit superior memory formation and behavioral change.  4. Obesity: current BMI 33.13 Rebecca Wood is currently in the action stage of change. As such, her goal is to continue with weight loss efforts. She has agreed to keeping a food journal and adhering to recommended goals of 1500-1600 calories and 100 grams of protein daily.   Exercise goals: Rebecca Wood will start pool exercise.  Behavioral modification strategies: increasing lean protein intake and decreasing simple carbohydrates.  Bette has agreed to follow-up with our clinic in 3 weeks. Rebecca Wood was informed we would discuss her lab results at her next visit unless there is a critical issue that needs to be addressed sooner. Rebecca Wood agreed to keep her next visit at the agreed upon time to discuss these results.  Objective:   Blood pressure 100/66, pulse 69, temperature 98.1 F (36.7 C), temperature source Oral, height 5\' 3"  (1.6 m), weight 183 lb (83 kg), SpO2  97 %. Body mass index is 32.42 kg/m.  General: Cooperative, alert, well developed, in no acute distress. HEENT: Conjunctivae and lids unremarkable. Cardiovascular: Regular rhythm.  Lungs: Normal work of breathing. Neurologic: No focal deficits.   Lab Results  Component Value Date   CREATININE 0.64 10/20/2020   BUN 7 10/20/2020   NA 137 10/20/2020   K 4.2 10/20/2020   CL 98 10/20/2020   CO2 21 10/20/2020   Lab Results  Component Value Date    ALT 77 (H) 10/20/2020   AST 83 (H) 10/20/2020   ALKPHOS 101 10/20/2020   BILITOT 1.0 10/20/2020   Lab Results  Component Value Date   HGBA1C 5.3 10/20/2020   Lab Results  Component Value Date   INSULIN 5.7 10/20/2020   Lab Results  Component Value Date   TSH 1.870 10/20/2020   Lab Results  Component Value Date   CHOL 208 (H) 10/20/2020   HDL 89 10/20/2020   LDLCALC 97 10/20/2020   TRIG 129 10/20/2020   CHOLHDL 2.3 10/20/2020   Lab Results  Component Value Date   WBC 7.9 10/20/2020   HGB 14.9 10/20/2020   HCT 43.5 10/20/2020   MCV 90 10/20/2020   PLT 418 10/20/2020   No results found for: IRON, TIBC, FERRITIN  Attestation Statements:   Reviewed by clinician on day of visit: allergies, medications, problem list, medical history, surgical history, family history, social history, and previous encounter notes.   Trude Mcburney, am acting as Energy manager for [place], FNP-C.  I have reviewed the above documentation for accuracy and completeness, and I agree with the above. -  Jesse Sans, FNP

## 2021-03-14 ENCOUNTER — Encounter (INDEPENDENT_AMBULATORY_CARE_PROVIDER_SITE_OTHER): Payer: Self-pay | Admitting: Family Medicine

## 2021-03-28 DIAGNOSIS — D225 Melanocytic nevi of trunk: Secondary | ICD-10-CM | POA: Diagnosis not present

## 2021-03-28 DIAGNOSIS — D485 Neoplasm of uncertain behavior of skin: Secondary | ICD-10-CM | POA: Diagnosis not present

## 2021-03-28 DIAGNOSIS — D2272 Melanocytic nevi of left lower limb, including hip: Secondary | ICD-10-CM | POA: Diagnosis not present

## 2021-03-28 DIAGNOSIS — D1801 Hemangioma of skin and subcutaneous tissue: Secondary | ICD-10-CM | POA: Diagnosis not present

## 2021-03-28 DIAGNOSIS — L821 Other seborrheic keratosis: Secondary | ICD-10-CM | POA: Diagnosis not present

## 2021-03-28 DIAGNOSIS — L738 Other specified follicular disorders: Secondary | ICD-10-CM | POA: Diagnosis not present

## 2021-04-03 ENCOUNTER — Ambulatory Visit (INDEPENDENT_AMBULATORY_CARE_PROVIDER_SITE_OTHER): Payer: BC Managed Care – PPO | Admitting: Adult Health

## 2021-04-10 DIAGNOSIS — M25552 Pain in left hip: Secondary | ICD-10-CM | POA: Diagnosis not present

## 2021-04-10 DIAGNOSIS — M25512 Pain in left shoulder: Secondary | ICD-10-CM | POA: Diagnosis not present

## 2021-04-10 DIAGNOSIS — M545 Low back pain, unspecified: Secondary | ICD-10-CM | POA: Diagnosis not present

## 2021-04-10 DIAGNOSIS — M25551 Pain in right hip: Secondary | ICD-10-CM | POA: Diagnosis not present

## 2021-04-11 DIAGNOSIS — F329 Major depressive disorder, single episode, unspecified: Secondary | ICD-10-CM | POA: Diagnosis not present

## 2021-04-11 DIAGNOSIS — F411 Generalized anxiety disorder: Secondary | ICD-10-CM | POA: Diagnosis not present

## 2021-04-14 DIAGNOSIS — M25552 Pain in left hip: Secondary | ICD-10-CM | POA: Diagnosis not present

## 2021-04-14 DIAGNOSIS — M545 Low back pain, unspecified: Secondary | ICD-10-CM | POA: Diagnosis not present

## 2021-04-14 DIAGNOSIS — M25551 Pain in right hip: Secondary | ICD-10-CM | POA: Diagnosis not present

## 2021-04-14 DIAGNOSIS — M25512 Pain in left shoulder: Secondary | ICD-10-CM | POA: Diagnosis not present

## 2021-04-19 ENCOUNTER — Other Ambulatory Visit: Payer: Self-pay

## 2021-04-19 ENCOUNTER — Ambulatory Visit (INDEPENDENT_AMBULATORY_CARE_PROVIDER_SITE_OTHER): Payer: BC Managed Care – PPO | Admitting: Family Medicine

## 2021-04-19 ENCOUNTER — Encounter (INDEPENDENT_AMBULATORY_CARE_PROVIDER_SITE_OTHER): Payer: Self-pay | Admitting: Family Medicine

## 2021-04-19 VITALS — BP 100/63 | HR 69 | Temp 97.8°F | Ht 63.0 in | Wt 177.0 lb

## 2021-04-19 DIAGNOSIS — E8881 Metabolic syndrome: Secondary | ICD-10-CM

## 2021-04-19 DIAGNOSIS — I10 Essential (primary) hypertension: Secondary | ICD-10-CM | POA: Diagnosis not present

## 2021-04-19 DIAGNOSIS — E559 Vitamin D deficiency, unspecified: Secondary | ICD-10-CM | POA: Diagnosis not present

## 2021-04-19 DIAGNOSIS — Z9189 Other specified personal risk factors, not elsewhere classified: Secondary | ICD-10-CM

## 2021-04-19 DIAGNOSIS — Z6838 Body mass index (BMI) 38.0-38.9, adult: Secondary | ICD-10-CM

## 2021-04-19 MED ORDER — OZEMPIC (0.25 OR 0.5 MG/DOSE) 2 MG/1.5ML ~~LOC~~ SOPN: 0.5000 mg | PEN_INJECTOR | SUBCUTANEOUS | 0 refills | Status: DC

## 2021-04-19 MED ORDER — VITAMIN D (ERGOCALCIFEROL) 1.25 MG (50000 UNIT) PO CAPS: 50000.0000 [IU] | ORAL_CAPSULE | ORAL | 0 refills | Status: DC

## 2021-04-20 ENCOUNTER — Encounter (INDEPENDENT_AMBULATORY_CARE_PROVIDER_SITE_OTHER): Payer: Self-pay | Admitting: Family Medicine

## 2021-04-20 DIAGNOSIS — M545 Low back pain, unspecified: Secondary | ICD-10-CM | POA: Diagnosis not present

## 2021-04-20 DIAGNOSIS — M25552 Pain in left hip: Secondary | ICD-10-CM | POA: Diagnosis not present

## 2021-04-20 DIAGNOSIS — M25512 Pain in left shoulder: Secondary | ICD-10-CM | POA: Diagnosis not present

## 2021-04-20 DIAGNOSIS — M25551 Pain in right hip: Secondary | ICD-10-CM | POA: Diagnosis not present

## 2021-04-20 NOTE — Telephone Encounter (Signed)
Please advise 

## 2021-04-24 NOTE — Telephone Encounter (Signed)
FYI

## 2021-04-24 NOTE — Progress Notes (Signed)
Chief Complaint:   OBESITY Rebecca Wood is here to discuss her progress with her obesity treatment plan along with follow-up of her obesity related diagnoses. Rebecca Wood is on the Category 3 Plan and keeping a food journal and adhering to recommended goals of 1500-1600 calories and 100 g protein and states she is following her eating plan approximately 60% of the time. Rebecca Wood states she is not currently exercising.  Today's visit was #: 11 Starting weight: 218 lbs Starting date: 10/20/2020 Today's weight: 177 lbs Today's date: 04/19/2021 Total lbs lost to date: 41 Total lbs lost since last in-office visit: 6  Interim History: Rebecca Wood notes she has been on a few trips and admits to drifting off plan. However, she has lost 6 lbs. She notes some changes in her meds for anxiety have been beneficial. She is weaning off of Trintellix and Buspar dose was increased.   Subjective:   1. Insulin resistance Rebecca Wood's appetite is fairly well controlled. Her last A1c was 5.3.   Lab Results  Component Value Date   INSULIN 5.7 10/20/2020   Lab Results  Component Value Date   HGBA1C 5.3 10/20/2020   2. Vitamin D deficiency Rebecca Wood's Vit D is low at 34.8. She is currently taking prescription vitamin D 50,000 IU each week.   Lab Results  Component Value Date   VD25OH 34.8 03/08/2021   VD25OH 27.4 (L) 10/20/2020   VD25OH 26 (L) 01/09/2013   3. Essential hypertension Rebecca Wood is on Norvasc 10 mg and losartan/HCTZ 100-25 mg. Her BP is low at 100/63 today. She notes recent dizziness at times.  BP Readings from Last 3 Encounters:  04/19/21 100/63  03/08/21 100/66  02/08/21 114/70   4. At risk for complication associated with hypotension The patient is at a higher than average risk of hypotension due to weight loss and medication.  Assessment/Plan:   1. Insulin resistance Increase Ozempic to 0.5 mg weekly.  Refill- Semaglutide,0.25 or 0.5MG /DOS, (OZEMPIC, 0.25 OR 0.5 MG/DOSE,) 2 MG/1.5ML SOPN; Inject 0.5  mg into the skin once a week.  Dispense: 1.5 mL; Refill: 0  2. Vitamin D deficiency She agrees to continue to take prescription Vitamin D @50 ,000 IU every week and will follow-up for routine testing of Vitamin D, at least 2-3 times per year to avoid over-replacement.  Refill- Vitamin D, Ergocalciferol, (DRISDOL) 1.25 MG (50000 UNIT) CAPS capsule; Take 1 capsule (50,000 Units total) by mouth every 7 (seven) days.  Dispense: 4 capsule; Refill: 0  3. Essential hypertension Discontinue losartan/HCTZ 100-25 mg. Start HCTZ 25 mg QD (pt has this at home). Continue Norvasc 10 mg QD.  4. At risk for complication associated with hypotension Rebecca Wood was given approximately 15 minutes of education and counseling today to help avoid hypotension. We discussed risks of hypotension with weight loss and signs of hypotension such as feeling lightheaded or unsteady.  Repetitive spaced learning was employed today to elicit superior memory formation and behavioral change.  5. Obesity: current BMI 31.36  Rebecca Wood is currently in the action stage of change. As such, her goal is to continue with weight loss efforts. She has agreed to the Category 3 Plan or keeping a food journal and adhering to recommended goals of 1500-1600 calories and 100 g protein.   Exercise goals: No exercise has been prescribed at this time.  Behavioral modification strategies: increasing lean protein intake, decreasing simple carbohydrates, and keeping a strict food journal.  Rebecca Wood has agreed to follow-up with our clinic in 3-4 weeks.  Objective:   Blood pressure 100/63, pulse 69, temperature 97.8 F (36.6 C), height 5\' 3"  (1.6 m), weight 177 lb (80.3 kg), SpO2 97 %. Body mass index is 31.35 kg/m.  General: Cooperative, alert, well developed, in no acute distress. HEENT: Conjunctivae and lids unremarkable. Cardiovascular: Regular rhythm.  Lungs: Normal work of breathing. Neurologic: No focal deficits.   Lab Results  Component Value  Date   CREATININE 0.64 10/20/2020   BUN 7 10/20/2020   NA 137 10/20/2020   K 4.2 10/20/2020   CL 98 10/20/2020   CO2 21 10/20/2020   Lab Results  Component Value Date   ALT 77 (H) 10/20/2020   AST 83 (H) 10/20/2020   ALKPHOS 101 10/20/2020   BILITOT 1.0 10/20/2020   Lab Results  Component Value Date   HGBA1C 5.3 10/20/2020   Lab Results  Component Value Date   INSULIN 5.7 10/20/2020   Lab Results  Component Value Date   TSH 1.870 10/20/2020   Lab Results  Component Value Date   CHOL 208 (H) 10/20/2020   HDL 89 10/20/2020   LDLCALC 97 10/20/2020   TRIG 129 10/20/2020   CHOLHDL 2.3 10/20/2020   Lab Results  Component Value Date   VD25OH 34.8 03/08/2021   VD25OH 27.4 (L) 10/20/2020   VD25OH 26 (L) 01/09/2013   Lab Results  Component Value Date   WBC 7.9 10/20/2020   HGB 14.9 10/20/2020   HCT 43.5 10/20/2020   MCV 90 10/20/2020   PLT 418 10/20/2020   No results found for: IRON, TIBC, FERRITIN  Attestation Statements:   Reviewed by clinician on day of visit: allergies, medications, problem list, medical history, surgical history, family history, social history, and previous encounter notes.  12/18/2020, CMA, am acting as Edmund Hilda for Energy manager, FNP.  I have reviewed the above documentation for accuracy and completeness, and I agree with the above. -  [place], FNP

## 2021-04-25 ENCOUNTER — Encounter (INDEPENDENT_AMBULATORY_CARE_PROVIDER_SITE_OTHER): Payer: Self-pay | Admitting: Family Medicine

## 2021-05-02 DIAGNOSIS — M545 Low back pain, unspecified: Secondary | ICD-10-CM | POA: Diagnosis not present

## 2021-05-02 DIAGNOSIS — M25552 Pain in left hip: Secondary | ICD-10-CM | POA: Diagnosis not present

## 2021-05-02 DIAGNOSIS — M25551 Pain in right hip: Secondary | ICD-10-CM | POA: Diagnosis not present

## 2021-05-02 DIAGNOSIS — M25512 Pain in left shoulder: Secondary | ICD-10-CM | POA: Diagnosis not present

## 2021-05-08 ENCOUNTER — Ambulatory Visit (INDEPENDENT_AMBULATORY_CARE_PROVIDER_SITE_OTHER): Payer: BC Managed Care – PPO | Admitting: Family Medicine

## 2021-05-09 DIAGNOSIS — M25552 Pain in left hip: Secondary | ICD-10-CM | POA: Diagnosis not present

## 2021-05-09 DIAGNOSIS — M545 Low back pain, unspecified: Secondary | ICD-10-CM | POA: Diagnosis not present

## 2021-05-09 DIAGNOSIS — M25551 Pain in right hip: Secondary | ICD-10-CM | POA: Diagnosis not present

## 2021-05-09 DIAGNOSIS — M25512 Pain in left shoulder: Secondary | ICD-10-CM | POA: Diagnosis not present

## 2021-05-16 ENCOUNTER — Ambulatory Visit (INDEPENDENT_AMBULATORY_CARE_PROVIDER_SITE_OTHER): Payer: BC Managed Care – PPO | Admitting: Family Medicine

## 2021-05-16 ENCOUNTER — Encounter (INDEPENDENT_AMBULATORY_CARE_PROVIDER_SITE_OTHER): Payer: Self-pay | Admitting: Family Medicine

## 2021-05-16 ENCOUNTER — Other Ambulatory Visit: Payer: Self-pay

## 2021-05-16 VITALS — BP 106/71 | HR 72 | Temp 97.6°F | Ht 63.0 in | Wt 169.0 lb

## 2021-05-16 DIAGNOSIS — I10 Essential (primary) hypertension: Secondary | ICD-10-CM

## 2021-05-16 DIAGNOSIS — E559 Vitamin D deficiency, unspecified: Secondary | ICD-10-CM | POA: Diagnosis not present

## 2021-05-16 DIAGNOSIS — Z6838 Body mass index (BMI) 38.0-38.9, adult: Secondary | ICD-10-CM

## 2021-05-16 DIAGNOSIS — E8881 Metabolic syndrome: Secondary | ICD-10-CM

## 2021-05-16 MED ORDER — VITAMIN D (ERGOCALCIFEROL) 1.25 MG (50000 UNIT) PO CAPS: 50000.0000 [IU] | ORAL_CAPSULE | ORAL | 0 refills | Status: DC

## 2021-05-16 MED ORDER — OZEMPIC (0.25 OR 0.5 MG/DOSE) 2 MG/1.5ML ~~LOC~~ SOPN: 0.5000 mg | PEN_INJECTOR | SUBCUTANEOUS | 0 refills | Status: DC

## 2021-05-17 NOTE — Progress Notes (Signed)
Chief Complaint:   OBESITY Rebecca Wood is here to discuss her progress with her obesity treatment plan along with follow-up of her obesity related diagnoses. [name] is on the Category 3 Plan and keeping a food journal and adhering to recommended goals of 1500-1600 calories and 100 grams protein and states she is following her eating plan approximately 60% of the time. [name] states she is not currently exercising.  Today's visit was #: 12 Starting weight: 218 lbs Starting date: 10/20/2020 Today's weight: 169 lbs Today's date: 05/16/2021 Total lbs lost to date: 49 Total lbs lost since last in-office visit: 8  Interim History: [name] is doing well with weight loss and has lost 49 lbs since January 2022. Her hip pain is much better. She is not exercising due to hip pain.  Subjective:   1. Insulin resistance Oris is on Ozempic 0.5 mg weekly. Her appetite is well controlled and she is tolerating Ozempic well. She feels Ozempic is working well. Pt denies nausea or constipation.  Lab Results  Component Value Date   INSULIN 5.7 10/20/2020   Lab Results  Component Value Date   HGBA1C 5.3 10/20/2020   2. Vitamin D deficiency Aaliayah's Vit D is low at 34.8. She is currently taking prescription vitamin D 50,000 IU each week.   Lab Results  Component Value Date   VD25OH 34.8 03/08/2021   VD25OH 27.4 (L) 10/20/2020   VD25OH 26 (L) 01/09/2013   3. Essential hypertension [name]'s BP runs low in the 100's/70's at home. She denies orthostatic symptoms. We discontinued losartan recently. She is on amlodipine 10 mg and HCTZ. She reports ankle edema.  BP Readings from Last 3 Encounters:  05/16/21 106/71  04/19/21 100/63  03/08/21 100/66   Lab Results  Component Value Date   CREATININE 0.64 10/20/2020   CREATININE 0.9 09/21/2020   CREATININE 0.7 02/08/2020   Assessment/Plan:   1. Insulin resistance  Refill- Semaglutide,0.25 or 0.5MG /DOS, (OZEMPIC, 0.25 OR 0.5 MG/DOSE,) 2 MG/1.5ML SOPN;  Inject 0.5 mg into the skin once a week.  Dispense: 1.5 mL; Refill: 0  2. Vitamin D deficiency  She agrees to continue to take prescription Vitamin D @50 ,000 IU every week and will follow-up for routine testing of Vitamin D, at least 2-3 times per year to avoid over-replacement.  Refill- Vitamin D, Ergocalciferol, (DRISDOL) 1.25 MG (50000 UNIT) CAPS capsule; Take 1 capsule (50,000 Units total) by mouth every 7 (seven) days.  Dispense: 4 capsule; Refill: 0  3. Essential hypertension [name] will continue amlodipine 10 mg and HCTZ 25 mg. We may need to reduce dose of amlodipine soon.   4. Overweight: Current BMI 29.94  [name] is currently in the action stage of change. As such, her goal is to continue with weight loss efforts. She has agreed to the Category 3 Plan and keeping a food journal and adhering to recommended goals of 1500-1600 calories and 100 grams protein.   Exercise goals: none due to hip pain.  Behavioral modification strategies: planning for success.  [name] has agreed to follow-up with our clinic in 4 weeks.  Objective:   Blood pressure 106/71, pulse 72, temperature 97.6 F (36.4 C), height 5\' 3"  (1.6 m), weight 169 lb (76.7 kg), SpO2 99 %. Body mass index is 29.94 kg/m.  General: Cooperative, alert, well developed, in no acute distress. HEENT: Conjunctivae and lids unremarkable. Cardiovascular: Regular rhythm.  Lungs: Normal work of breathing. Neurologic: No focal deficits.   Lab Results  Component Value Date  CREATININE 0.64 10/20/2020   BUN 7 10/20/2020   NA 137 10/20/2020   K 4.2 10/20/2020   CL 98 10/20/2020   CO2 21 10/20/2020   Lab Results  Component Value Date   ALT 77 (H) 10/20/2020   AST 83 (H) 10/20/2020   ALKPHOS 101 10/20/2020   BILITOT 1.0 10/20/2020   Lab Results  Component Value Date   HGBA1C 5.3 10/20/2020   Lab Results  Component Value Date   INSULIN 5.7 10/20/2020   Lab Results  Component Value Date   TSH 1.870 10/20/2020   Lab  Results  Component Value Date   CHOL 208 (H) 10/20/2020   HDL 89 10/20/2020   LDLCALC 97 10/20/2020   TRIG 129 10/20/2020   CHOLHDL 2.3 10/20/2020   Lab Results  Component Value Date   VD25OH 34.8 03/08/2021   VD25OH 27.4 (L) 10/20/2020   VD25OH 26 (L) 01/09/2013   Lab Results  Component Value Date   WBC 7.9 10/20/2020   HGB 14.9 10/20/2020   HCT 43.5 10/20/2020   MCV 90 10/20/2020   PLT 418 10/20/2020    Attestation Statements:   Reviewed by clinician on day of visit: allergies, medications, problem list, medical history, surgical history, family history, social history, and previous encounter notes.  Edmund Hilda, CMA, am acting as Energy manager for [place], FNP.  I have reviewed the above documentation for accuracy and completeness, and I agree with the above. -  Jesse Sans, FNP

## 2021-05-18 DIAGNOSIS — M25512 Pain in left shoulder: Secondary | ICD-10-CM | POA: Diagnosis not present

## 2021-05-18 DIAGNOSIS — M25552 Pain in left hip: Secondary | ICD-10-CM | POA: Diagnosis not present

## 2021-05-18 DIAGNOSIS — M25551 Pain in right hip: Secondary | ICD-10-CM | POA: Diagnosis not present

## 2021-05-18 DIAGNOSIS — M545 Low back pain, unspecified: Secondary | ICD-10-CM | POA: Diagnosis not present

## 2021-05-24 DIAGNOSIS — F329 Major depressive disorder, single episode, unspecified: Secondary | ICD-10-CM | POA: Diagnosis not present

## 2021-05-24 DIAGNOSIS — F411 Generalized anxiety disorder: Secondary | ICD-10-CM | POA: Diagnosis not present

## 2021-05-30 ENCOUNTER — Ambulatory Visit
Admission: RE | Admit: 2021-05-30 | Discharge: 2021-05-30 | Disposition: A | Discharge status: Home / Self Care | Payer: BC Managed Care – PPO | Source: Ambulatory Visit | Attending: Emergency Medicine | Admitting: Emergency Medicine

## 2021-05-30 ENCOUNTER — Other Ambulatory Visit: Payer: Self-pay

## 2021-05-30 VITALS — BP 131/78 | HR 82 | Temp 97.7°F | Resp 14 | Ht 63.0 in | Wt 166.0 lb

## 2021-05-30 DIAGNOSIS — R42 Dizziness and giddiness: Secondary | ICD-10-CM | POA: Insufficient documentation

## 2021-05-30 DIAGNOSIS — J029 Acute pharyngitis, unspecified: Secondary | ICD-10-CM | POA: Insufficient documentation

## 2021-05-30 DIAGNOSIS — K122 Cellulitis and abscess of mouth: Secondary | ICD-10-CM | POA: Diagnosis not present

## 2021-05-30 LAB — POCT URINALYSIS DIP (MANUAL ENTRY)
Bilirubin, UA: NEGATIVE
Glucose, UA: NEGATIVE mg/dL
Nitrite, UA: POSITIVE — AB
Protein Ur, POC: NEGATIVE mg/dL
Spec Grav, UA: 1.015 (ref 1.010–1.025)
Urobilinogen, UA: 0.2 E.U./dL
pH, UA: 5.5 (ref 5.0–8.0)

## 2021-05-30 LAB — POCT RAPID STREP A (OFFICE): Rapid Strep A Screen: NEGATIVE

## 2021-05-30 MED ORDER — CEPHALEXIN 500 MG PO CAPS: 500.0000 mg | ORAL_CAPSULE | Freq: Four times a day (QID) | ORAL | 0 refills | Status: AC

## 2021-05-30 MED ORDER — IBUPROFEN 600 MG PO TABS: 600.0000 mg | ORAL_TABLET | Freq: Four times a day (QID) | ORAL | 0 refills | Status: AC | PRN

## 2021-05-30 NOTE — ED Triage Notes (Signed)
Pt presents w/ c/o sore throat and c/o lightheadedness and LOC that occurred yesterday. She states she "passed out and had an accident". Pt states she had diarrhea x2 yesterday and emesis x1

## 2021-05-30 NOTE — Discharge Instructions (Addendum)
Strep test negative Urine is suggestive of UTI, urine culture pending Drink plenty of water and fluids Begin Keflex 4 times daily x5 days Ibuprofen and Tylenol to further help with throat pain Please ensure lightheaded spells are not recurring Follow-up if not improving or worsening

## 2021-05-30 NOTE — ED Provider Notes (Signed)
[facility]    CSN: [id number] Arrival date & time: 05/30/21  1242      History   Chief Complaint Chief Complaint  Patient presents with   Sore Throat    APPT 1PM   Dizziness   Loss of Consciousness    HPI Rebecca Wood is a 55 y.o. female presenting today for evaluation of sore throat.  Reports sore throat over the past 4 days, worsening of recently.  Also reports associated fatigue and lightheadedness.  Reports spell of lightheadedness Sunday evening as well as a spell last night which she reports possible LOC.  Had an episode of incontinence/diarrhea with spell last night.  She denies falling to the floor, but did fall to her knees.  Denies hitting head.  She denies any fevers.  Does report decreased oral intake/fluids recently due to sore throat.  Also has noticed some darker colored urine.  HPI  Past Medical History:  Diagnosis Date   Anxiety    B12 deficiency    Back pain    Chest pain    Depression    Heartburn    Hx of blood clots    Hypertension, essential    Joint pain    Lactose intolerance    Obesity    PMDD (premenstrual dysphoric disorder)    SOBOE (shortness of breath on exertion)    Swelling of both lower extremities    Vitamin D deficiency     Patient Active Problem List   Diagnosis Date Noted   Insulin resistance 12/20/2020   Other insomnia 11/17/2020   Anxious mood 11/17/2020   At risk for anxiety 11/17/2020   Other fatigue 10/20/2020   SOBOE (shortness of breath on exertion) 10/20/2020   Essential hypertension 10/20/2020   Chronic joint pain 10/20/2020   Vitamin B 12 deficiency 10/20/2020   S/P gastric bypass 10/20/2020   Vitamin D deficiency 10/20/2020   Mood disorder (HCC), with emotional eating 10/20/2020   At risk for impaired metabolic function 10/20/2020   Varicose veins of bilateral lower extremities with other complications 12/18/2016   Shortness of breath 10/11/2016   Palpitations 02/08/2014   Class 2 severe obesity with  serious comorbidity and body mass index (BMI) of 38.0 to 38.9 in adult [facility])    PMDD (premenstrual dysphoric disorder)    Chest pain 12/28/2010    Past Surgical History:  Procedure Laterality Date   GASTRIC BYPASS  2006    OB History     Gravida  0   Para      Term      Preterm      AB      Living         SAB      IAB      Ectopic      Multiple      Live Births               Home Medications    Prior to Admission medications   Medication Sig Start Date End Date Taking? Authorizing Provider  cephALEXin (KEFLEX) 500 MG capsule Take 1 capsule (500 mg total) by mouth 4 (four) times daily for 5 days. 05/30/21 06/04/21 Yes Vaishali Baise C, PA-C  ibuprofen (ADVIL) 600 MG tablet Take 1 tablet (600 mg total) by mouth every 6 (six) hours as needed. 05/30/21  Yes Tabithia Stroder C, PA-C  amLODipine (NORVASC) 10 MG tablet Take 10 mg by mouth daily.    [provider]  busPIRone (BUSPAR) 10  MG tablet Take 10 mg by mouth 2 (two) times daily.    [provider]  hydrochlorothiazide (HYDRODIURIL) 25 MG tablet Take 25 mg by mouth daily.    [provider]  Loratadine 10 MG CAPS Take 10 mg by mouth daily.    [provider]  Methylcellulose, Laxative, (CITRUCEL PO) Take 2 tablets by mouth daily.     [provider]  Multiple Vitamin (MULTIVITAMIN) tablet Take 1 tablet by mouth daily.     [provider]  omeprazole (PRILOSEC) 40 MG capsule Take 40 mg by mouth daily.    [provider]  Semaglutide,0.25 or 0.5MG /DOS, (OZEMPIC, 0.25 OR 0.5 MG/DOSE,) 2 MG/1.5ML SOPN Inject 0.5 mg into the skin once a week. 05/16/21   Whitmire, Thermon Leyland, FNP  Vitamin D, Ergocalciferol, (DRISDOL) 1.25 MG (50000 UNIT) CAPS capsule Take 1 capsule (50,000 Units total) by mouth every 7 (seven) days. 05/16/21   Whitmire, Thermon Leyland, FNP    Family History Family History  Problem Relation Age of Onset   Cancer Mother 77       kidney   Diabetes  Mother    Hypertension Mother    Kidney disease Mother    Thyroid disease Mother    Kidney cancer Mother    Anxiety disorder Mother    Obesity Mother    Heart attack Father 29   Heart disease Father    Depression Brother    Heart attack Brother    Heart disease Brother     Social History Social History   Tobacco Use   Smoking status: Never   Smokeless tobacco: Never  Substance Use Topics   Alcohol use: Yes    Comment: occasional liquor   Drug use: No     Allergies   Codeine   Review of Systems Review of Systems  Constitutional:  Positive for fatigue. Negative for activity change, appetite change, chills and fever.  HENT:  Positive for sore throat. Negative for congestion, ear pain, rhinorrhea, sinus pressure and trouble swallowing.   Eyes:  Negative for discharge and redness.  Respiratory:  Negative for cough, chest tightness and shortness of breath.   Cardiovascular:  Negative for chest pain.  Gastrointestinal:  Positive for diarrhea. Negative for abdominal pain, nausea and vomiting.  Genitourinary:  Positive for urgency. Negative for dysuria.  Musculoskeletal:  Negative for myalgias.  Skin:  Negative for rash.  Neurological:  Positive for light-headedness. Negative for dizziness and headaches.    Physical Exam Triage Vital Signs ED Triage Vitals  Enc Vitals Group     BP      Pulse      Resp      Temp      Temp src      SpO2      Weight      Height      Head Circumference      Peak Flow      Pain Score      Pain Loc      Pain Edu?      Excl. in GC?    Orthostatic VS for the past 24 hrs:  BP- Lying Pulse- Lying BP- Sitting Pulse- Sitting BP- Standing at 0 minutes Pulse- Standing at 0 minutes  05/30/21 1405 143/80 82 127/80 85 118/73 87    Updated Vital Signs BP 131/78 (BP Location: Right Arm)   Pulse 82   Temp 97.7 F (36.5 C) (Oral)   Resp 14   Ht 5\' 3"  (1.6  m)   Wt 166 lb (75.3 kg)   SpO2 96%   BMI 29.41 kg/m   Visual Acuity Right  Eye Distance:   Left Eye Distance:   Bilateral Distance:    Right Eye Near:   Left Eye Near:    Bilateral Near:     Physical Exam Vitals and nursing note reviewed.  Constitutional:      Appearance: She is well-developed.     Comments: No acute distress  HENT:     Head: Normocephalic and atraumatic.     Nose: Nose normal.     Mouth/Throat:     Comments: Bilateral tonsils not enlarged or erythematous, no exudate, uvula erythematous and mildly swollen extending slightly onto soft palate, no deviation, controlling secretions, speaking in full sentences Eyes:     Extraocular Movements: Extraocular movements intact.     Conjunctiva/sclera: Conjunctivae normal.     Pupils: Pupils are equal, round, and reactive to light.  Cardiovascular:     Rate and Rhythm: Normal rate.  Pulmonary:     Effort: Pulmonary effort is normal. No respiratory distress.     Comments: Breathing comfortably at rest, CTABL, no wheezing, rales or other adventitious sounds auscultated Abdominal:     General: There is no distension.  Musculoskeletal:        General: Normal range of motion.     Cervical back: Neck supple.  Skin:    General: Skin is warm and dry.  Neurological:     Mental Status: She is alert and oriented to person, place, and time.     UC Treatments / Results  Labs (all labs ordered are listed, but only abnormal results are displayed) Labs Reviewed  POCT URINALYSIS DIP (MANUAL ENTRY) - Abnormal; Notable for the following components:      Result Value   Clarity, UA cloudy (*)    Ketones, POC UA small (15) (*)    Blood, UA trace-intact (*)    Nitrite, UA Positive (*)    Leukocytes, UA Small (1+) (*)    All other components within normal limits  URINE CULTURE  CULTURE, GROUP A STREP [facility]  POCT RAPID STREP A (OFFICE)    EKG   Radiology No results found.  Procedures Procedures (including critical care time)  Medications Ordered in UC Medications - No data to  display  Initial Impression / Assessment and Plan / UC Course  I have reviewed the triage vital signs and the nursing notes.  Pertinent labs & imaging results that were available during my care of the patient were reviewed by me and considered in my medical decision making (see chart for details).     Sore throat-strep negative, appears to have uvulitis, questionable bacterial versus viral etiology, recommend symptomatic and supportive care Possible UTI-small leuks and positive nitrates on UA, treating with Keflex, push fluids Mild dehydration-some ketones in urine, slightly orthostatic, stressed importance of oral rehydration and fluids, likely source of lightheadedness/presyncope  Discussed with patient following up in emergency room if having repeat spells of loss of consciousness/incontinence.  Discussed strict return precautions. Patient verbalized understanding and is agreeable with plan.  Final Clinical Impressions(s) / UC Diagnoses   Final diagnoses:  Lightheadedness  Sore throat  Uvulitis     Discharge Instructions      Strep test negative Urine is suggestive of UTI, urine culture pending Drink plenty of water and fluids Begin Keflex 4 times daily x5 days Ibuprofen and Tylenol to further help with throat pain Please ensure lightheaded spells  are not recurring Follow-up if not improving or worsening     ED Prescriptions     Medication Sig Dispense Auth. Provider   cephALEXin (KEFLEX) 500 MG capsule Take 1 capsule (500 mg total) by mouth 4 (four) times daily for 5 days. 20 capsule Sheritta Deeg C, PA-C   ibuprofen (ADVIL) 600 MG tablet Take 1 tablet (600 mg total) by mouth every 6 (six) hours as needed. 30 tablet Quintez Maselli, [place] C, PA-C      PDMP not reviewed this encounter.   Kenniyah, Sasaki, PA-C 05/30/21 1450

## 2021-06-01 LAB — URINE CULTURE: Culture: >=100000 — AB

## 2021-06-02 LAB — CULTURE, GROUP A STREP (THRC)

## 2021-06-05 ENCOUNTER — Telehealth (HOSPITAL_COMMUNITY): Payer: Self-pay | Admitting: Emergency Medicine

## 2021-06-05 MED ORDER — PENICILLIN V POTASSIUM 500 MG PO TABS: 500.0000 mg | ORAL_TABLET | Freq: Two times a day (BID) | ORAL | 0 refills | Status: AC

## 2021-06-08 DIAGNOSIS — I776 Arteritis, unspecified: Secondary | ICD-10-CM | POA: Diagnosis not present

## 2021-06-08 LAB — HEPATIC FUNCTION PANEL
ALT: 33 (ref 7–35)
AST: 28 (ref 13–35)
Alkaline Phosphatase: 56 (ref 25–125)
Bilirubin, Total: 0.8

## 2021-06-08 LAB — CBC AND DIFFERENTIAL
HCT: 39 (ref 36–46)
Hemoglobin: 13 (ref 12.0–16.0)
Platelets: 466 — AB (ref 150–399)
WBC: 6.1

## 2021-06-08 LAB — BASIC METABOLIC PANEL
BUN: 10 (ref 4–21)
CO2: 30 — AB (ref 13–22)
Chloride: 98 — AB (ref 99–108)
Creatinine: 0.7 (ref 0.5–1.1)
Glucose: 92
Potassium: 4 (ref 3.4–5.3)
Sodium: 136 — AB (ref 137–147)

## 2021-06-08 LAB — COMPREHENSIVE METABOLIC PANEL
Calcium: 9.8 (ref 8.7–10.7)
GFR calc non Af Amer: 99

## 2021-06-08 LAB — CBC: RBC: 4.51 (ref 3.87–5.11)

## 2021-06-14 ENCOUNTER — Encounter (INDEPENDENT_AMBULATORY_CARE_PROVIDER_SITE_OTHER): Payer: Self-pay | Admitting: Family Medicine

## 2021-06-14 ENCOUNTER — Other Ambulatory Visit: Payer: Self-pay

## 2021-06-14 ENCOUNTER — Ambulatory Visit (INDEPENDENT_AMBULATORY_CARE_PROVIDER_SITE_OTHER): Payer: BC Managed Care – PPO | Admitting: Family Medicine

## 2021-06-14 VITALS — BP 121/68 | HR 72 | Temp 97.7°F | Ht 63.0 in | Wt 164.0 lb

## 2021-06-14 DIAGNOSIS — I10 Essential (primary) hypertension: Secondary | ICD-10-CM | POA: Diagnosis not present

## 2021-06-14 DIAGNOSIS — E559 Vitamin D deficiency, unspecified: Secondary | ICD-10-CM

## 2021-06-14 DIAGNOSIS — Z6838 Body mass index (BMI) 38.0-38.9, adult: Secondary | ICD-10-CM

## 2021-06-14 DIAGNOSIS — E8881 Metabolic syndrome: Secondary | ICD-10-CM

## 2021-06-14 MED ORDER — VITAMIN D (ERGOCALCIFEROL) 1.25 MG (50000 UNIT) PO CAPS: 50000.0000 [IU] | ORAL_CAPSULE | ORAL | 0 refills | Status: DC

## 2021-06-14 MED ORDER — OZEMPIC (0.25 OR 0.5 MG/DOSE) 2 MG/1.5ML ~~LOC~~ SOPN: 0.5000 mg | PEN_INJECTOR | SUBCUTANEOUS | 0 refills | Status: DC

## 2021-06-14 NOTE — Progress Notes (Signed)
Chief Complaint:   OBESITY Rebecca Wood is here to discuss her progress with her obesity treatment plan along with follow-up of her obesity related diagnoses. Rebecca Wood is on the Category 3 Plan and keeping a food journal and adhering to recommended goals of 1500-1600 calories and 100 grams of protein and states she is following her eating plan approximately 60% of the time. Rebecca Wood states she is doing 0 minutes 0 times per week.  Today's visit was #: 13 Starting weight: 218 lbs Starting date: 10/20/2020 Today's weight: 164 lbs Today's date: 06/14/2021 Total lbs lost to date: 54 lbs Total lbs lost since last in-office visit: 5 lbs  Interim History: Rebecca Wood recently went on a week long trip. She did not journal on her trip but she did walk several miles daily. She is not journaling consistently since she has been back.  Her hip pain is much improved.  Subjective:   1. Insulin resistance Ozempic is working well for appetite suppression for [place]. Her appetite is well controlled.  Lab Results  Component Value Date   INSULIN 5.7 10/20/2020   Lab Results  Component Value Date   HGBA1C 5.3 10/20/2020    2. Vitamin D deficiency Rebecca Wood's Vitamin D is low at 34.8. She is on weekly prescription Vitamin D.  Lab Results  Component Value Date   VD25OH 34.8 03/08/2021   VD25OH 27.4 (L) 10/20/2020   VD25OH 26 (L) 01/09/2013    3. Essential hypertension Rebecca Wood's blood pressure runs 120's/60's at home.  She denies orthostatic symptoms. She is on Norvasc 10 mg and HCTZ.  BP Readings from Last 3 Encounters:  06/14/21 121/68  05/30/21 131/78  05/16/21 106/71     Assessment/Plan:   1. Insulin resistance  We will refill Ozempic 0.5 mg weekly. Bonney agreed to follow-up with [place] as directed to closely monitor her progress.  - Semaglutide,0.25 or 0.5MG /DOS, (OZEMPIC, 0.25 OR 0.5 MG/DOSE,) 2 MG/1.5ML SOPN; Inject 0.5 mg into the skin once a week.  Dispense: 1.5 mL; Refill: 0  2. Vitamin D  deficiency  We will refill prescription Vitamin D 50,000 IU every week and she will follow-up for routine testing of Vitamin D, at least 2-3 times per year to avoid over-replacement.  - Vitamin D, Ergocalciferol, (DRISDOL) 1.25 MG (50000 UNIT) CAPS capsule; Take 1 capsule (50,000 Units total) by mouth every 7 (seven) days.  Dispense: 4 capsule; Refill: 0  3. Essential hypertension Rebecca Wood will continue Norvasc and HCTZ.  We will watch for signs of hypotension as she continues her lifestyle modifications.   4. Overweight: Current BMI 29.06 Rebecca Wood is currently in the action stage of change. As such, her goal is to continue with weight loss efforts. She has agreed to keeping a food journal and adhering to recommended goals of 1500-1600 calories and 100 grams of  protein daily.  Exercise goals:  Rebecca Wood will restart exercise.  Behavioral modification strategies: keeping a strict food journal.  Rebecca Wood has agreed to follow-up with our clinic in 3 weeks.   Objective:   Blood pressure 121/68, pulse 72, temperature 97.7 F (36.5 C), height 5\' 3"  (1.6 m), weight 164 lb (74.4 kg), SpO2 99 %. Body mass index is 29.05 kg/m.  General: Cooperative, alert, well developed, in no acute distress. HEENT: Conjunctivae and lids unremarkable. Cardiovascular: Regular rhythm.  Lungs: Normal work of breathing. Neurologic: No focal deficits.   Lab Results  Component Value Date   CREATININE 0.64 10/20/2020   BUN 7 10/20/2020   NA 137  10/20/2020   K 4.2 10/20/2020   CL 98 10/20/2020   CO2 21 10/20/2020   Lab Results  Component Value Date   ALT 77 (H) 10/20/2020   AST 83 (H) 10/20/2020   ALKPHOS 101 10/20/2020   BILITOT 1.0 10/20/2020   Lab Results  Component Value Date   HGBA1C 5.3 10/20/2020   Lab Results  Component Value Date   INSULIN 5.7 10/20/2020   Lab Results  Component Value Date   TSH 1.870 10/20/2020   Lab Results  Component Value Date   CHOL 208 (H) 10/20/2020   HDL 89  10/20/2020   LDLCALC 97 10/20/2020   TRIG 129 10/20/2020   CHOLHDL 2.3 10/20/2020   Lab Results  Component Value Date   VD25OH 34.8 03/08/2021   VD25OH 27.4 (L) 10/20/2020   VD25OH 26 (L) 01/09/2013   Lab Results  Component Value Date   WBC 7.9 10/20/2020   HGB 14.9 10/20/2020   HCT 43.5 10/20/2020   MCV 90 10/20/2020   PLT 418 10/20/2020   No results found for: IRON, TIBC, FERRITIN  Attestation Statements:   Reviewed by clinician on day of visit: allergies, medications, problem list, medical history, surgical history, family history, social history, and previous encounter notes.  I, Jackson Latino, RMA, am acting as Energy manager for [place], FNP.   I have reviewed the above documentation for accuracy and completeness, and I agree with the above. -  Jesse Sans, FNP

## 2021-06-15 DIAGNOSIS — I776 Arteritis, unspecified: Secondary | ICD-10-CM | POA: Diagnosis not present

## 2021-06-15 DIAGNOSIS — R21 Rash and other nonspecific skin eruption: Secondary | ICD-10-CM | POA: Diagnosis not present

## 2021-06-15 DIAGNOSIS — L959 Vasculitis limited to the skin, unspecified: Secondary | ICD-10-CM | POA: Diagnosis not present

## 2021-06-15 DIAGNOSIS — M359 Systemic involvement of connective tissue, unspecified: Secondary | ICD-10-CM | POA: Diagnosis not present

## 2021-07-05 ENCOUNTER — Ambulatory Visit (INDEPENDENT_AMBULATORY_CARE_PROVIDER_SITE_OTHER): Payer: BC Managed Care – PPO | Admitting: Physician Assistant

## 2021-07-05 ENCOUNTER — Encounter (INDEPENDENT_AMBULATORY_CARE_PROVIDER_SITE_OTHER): Payer: Self-pay | Admitting: Physician Assistant

## 2021-07-05 ENCOUNTER — Other Ambulatory Visit: Payer: Self-pay

## 2021-07-05 VITALS — BP 103/66 | HR 69 | Temp 97.6°F | Ht 63.0 in | Wt 162.0 lb

## 2021-07-05 DIAGNOSIS — E559 Vitamin D deficiency, unspecified: Secondary | ICD-10-CM | POA: Diagnosis not present

## 2021-07-05 DIAGNOSIS — E8881 Metabolic syndrome: Secondary | ICD-10-CM

## 2021-07-05 DIAGNOSIS — E66812 Obesity, class 2: Secondary | ICD-10-CM

## 2021-07-05 DIAGNOSIS — Z9189 Other specified personal risk factors, not elsewhere classified: Secondary | ICD-10-CM | POA: Diagnosis not present

## 2021-07-05 DIAGNOSIS — Z6838 Body mass index (BMI) 38.0-38.9, adult: Secondary | ICD-10-CM

## 2021-07-05 DIAGNOSIS — E88819 Insulin resistance, unspecified: Secondary | ICD-10-CM

## 2021-07-05 MED ORDER — VITAMIN D (ERGOCALCIFEROL) 1.25 MG (50000 UNIT) PO CAPS: 50000.0000 [IU] | ORAL_CAPSULE | ORAL | 0 refills | Status: DC

## 2021-07-05 MED ORDER — OZEMPIC (0.25 OR 0.5 MG/DOSE) 2 MG/1.5ML ~~LOC~~ SOPN: 0.5000 mg | PEN_INJECTOR | SUBCUTANEOUS | 0 refills | Status: DC

## 2021-07-05 NOTE — Progress Notes (Signed)
Chief Complaint:   OBESITY Rebecca Wood is here to discuss her progress with her obesity treatment plan along with follow-up of her obesity related diagnoses. Rebecca Wood is on keeping a food journal and adhering to recommended goals of 1500-1600 calories and 100 grams of protein and states she is following her eating plan approximately 50% of the time. Rebecca Wood states she is doing 0 minutes 0 times per week.  Today's visit was #: 14 Starting weight: 218 lbs Starting date: 10/20/2020 Today's weight: 162 lbs Today's date: 07/05/2021 Total lbs lost to date: 56 lbs Total lbs lost since last in-office visit: 2 lbs  Interim History: Rebecca Wood continues to do well with weight loss despite not journaling consistently and eating out more often. She wants to get back to walking now that her hip is feeling better. She recently had labs with her PCP and will provide those next visit.  Subjective:   1. Insulin resistance Rebecca Wood is currently on Ozempic 0.5 mg. Rebecca Wood states it helps with appetite control.   2. Vitamin D deficiency Rebecca Wood is currently on Vitamin D and tolerating it well.  3. At risk for diabetes mellitus Rebecca Wood is at higher than average risk for developing diabetes due to obesity.    Assessment/Plan:   1. Insulin resistance We will refill Ozempic 0.5 mg for 1 month with no refills. Rebecca Wood will continue to work on weight loss, exercise, and decreasing simple carbohydrates to help decrease the risk of diabetes. Rebecca Wood agreed to follow-up with Rebecca Wood as directed to closely monitor her progress.  - Semaglutide,0.25 or 0.5MG /DOS, (OZEMPIC, 0.25 OR 0.5 MG/DOSE,) 2 MG/1.5ML SOPN; Inject 0.5 mg into the skin once a week.  Dispense: 1.5 mL; Refill: 0  2. Vitamin D deficiency Low Vitamin D level contributes to fatigue and are associated with obesity, breast, and colon cancer. We will refill prescription Vitamin D 50,000 IU every week and Rebecca Wood will follow-up for routine testing of Vitamin D, at least 2-3 times per  year to avoid over-replacement.  - Vitamin D, Ergocalciferol, (DRISDOL) 1.25 MG (50000 UNIT) CAPS capsule; Take 1 capsule (50,000 Units total) by mouth every 7 (seven) days.  Dispense: 4 capsule; Refill: 0  3. At risk for diabetes mellitus Rebecca Wood was given approximately 15 minutes of diabetes education and counseling today. We discussed intensive lifestyle modifications today with an emphasis on weight loss as well as increasing exercise and decreasing simple carbohydrates in her diet. We also reviewed medication options with an emphasis on risk versus benefit of those discussed.   Repetitive spaced learning was employed today to elicit superior memory formation and behavioral change.   4. Overweight: Current BMI 28.7 Rebecca Wood is currently in the action stage of change. As such, her goal is to continue with weight loss efforts. She has agreed to keeping a food journal and adhering to recommended goals of 1500-1600 calories and 100 grams of protein daily.  Exercise goals: No exercise has been prescribed at this time.  Behavioral modification strategies: planning for success and keeping a strict food journal.  Rebecca Wood has agreed to follow-up with our clinic in 3-4 weeks. She was informed of the importance of frequent follow-up visits to maximize her success with intensive lifestyle modifications for her multiple health conditions.   Objective:   Blood pressure 103/66, pulse 69, temperature 97.6 F (36.4 C), height 5\' 3"  (1.6 m), weight 162 lb (73.5 kg), SpO2 99 %. Body mass index is 28.7 kg/m.  General: Cooperative, alert, well developed, in no acute  distress. HEENT: Conjunctivae and lids unremarkable. Cardiovascular: Regular rhythm.  Lungs: Normal work of breathing. Neurologic: No focal deficits.   Lab Results  Component Value Date   CREATININE 0.64 10/20/2020   BUN 7 10/20/2020   NA 137 10/20/2020   K 4.2 10/20/2020   CL 98 10/20/2020   CO2 21 10/20/2020   Lab Results  Component  Value Date   ALT 77 (H) 10/20/2020   AST 83 (H) 10/20/2020   ALKPHOS 101 10/20/2020   BILITOT 1.0 10/20/2020   Lab Results  Component Value Date   HGBA1C 5.3 10/20/2020   Lab Results  Component Value Date   INSULIN 5.7 10/20/2020   Lab Results  Component Value Date   TSH 1.870 10/20/2020   Lab Results  Component Value Date   CHOL 208 (H) 10/20/2020   HDL 89 10/20/2020   LDLCALC 97 10/20/2020   TRIG 129 10/20/2020   CHOLHDL 2.3 10/20/2020   Lab Results  Component Value Date   VD25OH 34.8 03/08/2021   VD25OH 27.4 (L) 10/20/2020   VD25OH 26 (L) 01/09/2013   Lab Results  Component Value Date   WBC 7.9 10/20/2020   HGB 14.9 10/20/2020   HCT 43.5 10/20/2020   MCV 90 10/20/2020   PLT 418 10/20/2020   No results found for: IRON, TIBC, FERRITIN  Attestation Statements:   Reviewed by clinician on day of visit: allergies, medications, problem list, medical history, surgical history, family history, social history, and previous encounter notes.  I, Sindy Messing, am acting as Energy manager for [employer], PA-C.   I have reviewed the above documentation for accuracy and completeness, and I agree with the above. Alois Cliche, PA-C

## 2021-07-26 ENCOUNTER — Other Ambulatory Visit (INDEPENDENT_AMBULATORY_CARE_PROVIDER_SITE_OTHER): Payer: Self-pay

## 2021-07-26 ENCOUNTER — Other Ambulatory Visit: Payer: Self-pay

## 2021-07-26 ENCOUNTER — Encounter (INDEPENDENT_AMBULATORY_CARE_PROVIDER_SITE_OTHER): Payer: Self-pay | Admitting: Family Medicine

## 2021-07-26 ENCOUNTER — Ambulatory Visit (INDEPENDENT_AMBULATORY_CARE_PROVIDER_SITE_OTHER): Payer: BC Managed Care – PPO | Admitting: Family Medicine

## 2021-07-26 VITALS — BP 105/65 | HR 74 | Temp 97.7°F | Ht 63.0 in | Wt 159.0 lb

## 2021-07-26 DIAGNOSIS — E8881 Metabolic syndrome: Secondary | ICD-10-CM | POA: Diagnosis not present

## 2021-07-26 DIAGNOSIS — E559 Vitamin D deficiency, unspecified: Secondary | ICD-10-CM | POA: Diagnosis not present

## 2021-07-26 DIAGNOSIS — Z6838 Body mass index (BMI) 38.0-38.9, adult: Secondary | ICD-10-CM

## 2021-07-26 MED ORDER — OZEMPIC (0.25 OR 0.5 MG/DOSE) 2 MG/1.5ML ~~LOC~~ SOPN: 0.5000 mg | PEN_INJECTOR | SUBCUTANEOUS | 0 refills | Status: DC

## 2021-07-26 MED ORDER — VITAMIN D (ERGOCALCIFEROL) 1.25 MG (50000 UNIT) PO CAPS: 50000.0000 [IU] | ORAL_CAPSULE | ORAL | 0 refills | Status: DC

## 2021-07-26 NOTE — Progress Notes (Signed)
Chief Complaint:   OBESITY Rebecca Wood is here to discuss her progress with her obesity treatment plan along with follow-up of her obesity related diagnoses. Rebecca Wood is on the Category 3 Plan and keeping a food journal and adhering to recommended goals of 1500-1600 calories and 100 grams of protein and states she is following her eating plan approximately 70% of the time. Rebecca Wood states she is walking for 30 minutes 3 times per week.  Today's visit was #: 15 Starting weight: 218 lbs Starting date: 10/20/2020 Today's weight: 159 lbs Today's date: 07/26/2021 Total lbs lost to date: 59 lbs Total lbs lost since last in-office visit: 3 lbs  Interim History: Rebecca Wood is undergoing rheumatology work up for vasculitis in her legs.  Her protein intake is good. She is journaling consistently. Her weight goal is 150-155 lbs and she is nearly there.  She would like to add more exercise.  Subjective:   1. Insulin resistance Diya's appetite is well controlled. She denies side effects with Ozempic.  Lab Results  Component Value Date   INSULIN 5.7 10/20/2020   Lab Results  Component Value Date   HGBA1C 5.3 10/20/2020    2. Vitamin D deficiency Rebecca Wood's Vitamin D is low at 34.8. She is on weekly prescription Vitamin D.   Lab Results  Component Value Date   VD25OH 34.8 03/08/2021   VD25OH 27.4 (L) 10/20/2020   VD25OH 26 (L) 01/09/2013    Assessment/Plan:   1. Insulin resistance  We will refill Ozempic 0.5 mg weekly.  - Semaglutide,0.25 or 0.5MG /DOS, (OZEMPIC, 0.25 OR 0.5 MG/DOSE,) 2 MG/1.5ML SOPN; Inject 0.5 mg into the skin once a week.  Dispense: 1.5 mL; Refill: 0  2. Vitamin D deficiency  We will refill prescription Vitamin D 50,000 IU every week and Henrine will follow-up for routine testing of Vitamin D, at least 2-3 times per year to avoid over-replacement.  - Vitamin D, Ergocalciferol, (DRISDOL) 1.25 MG (50000 UNIT) CAPS capsule; Take 1 capsule (50,000 Units total) by mouth every 7  (seven) days.  Dispense: 4 capsule; Refill: 0  3. Overweight: Current BMI 28.17 Rebecca Wood is currently in the action stage of change. As such, her goal is to continue with weight loss efforts. She has agreed to keeping a food journal and adhering to recommended goals of 1500-1600 calories and 100 grams of protein daily.   Resistance bands were provided for Rebecca Wood today.  Exercise goals:  Rebecca Wood will add resistance 2-3 times per week.  Behavioral modification strategies: meal planning and cooking strategies and planning for success.  Rebecca Wood has agreed to follow-up with our clinic in 3-4 weeks(fasting).  Objective:   Blood pressure 105/65, pulse 74, temperature 97.7 F (36.5 C), height 5\' 3"  (1.6 m), weight 159 lb (72.1 kg), SpO2 99 %. Body mass index is 28.17 kg/m.  General: Cooperative, alert, well developed, in no acute distress. HEENT: Conjunctivae and lids unremarkable. Cardiovascular: Regular rhythm.  Lungs: Normal work of breathing. Neurologic: No focal deficits.   Lab Results  Component Value Date   CREATININE 0.64 10/20/2020   BUN 7 10/20/2020   NA 137 10/20/2020   K 4.2 10/20/2020   CL 98 10/20/2020   CO2 21 10/20/2020   Lab Results  Component Value Date   ALT 77 (H) 10/20/2020   AST 83 (H) 10/20/2020   ALKPHOS 101 10/20/2020   BILITOT 1.0 10/20/2020   Lab Results  Component Value Date   HGBA1C 5.3 10/20/2020   Lab Results  Component Value  Date   INSULIN 5.7 10/20/2020   Lab Results  Component Value Date   TSH 1.870 10/20/2020   Lab Results  Component Value Date   CHOL 208 (H) 10/20/2020   HDL 89 10/20/2020   LDLCALC 97 10/20/2020   TRIG 129 10/20/2020   CHOLHDL 2.3 10/20/2020   Lab Results  Component Value Date   VD25OH 34.8 03/08/2021   VD25OH 27.4 (L) 10/20/2020   VD25OH 26 (L) 01/09/2013   Lab Results  Component Value Date   WBC 7.9 10/20/2020   HGB 14.9 10/20/2020   HCT 43.5 10/20/2020   MCV 90 10/20/2020   PLT 418 10/20/2020   No  results found for: IRON, TIBC, FERRITIN  Attestation Statements:   Reviewed by clinician on day of visit: allergies, medications, problem list, medical history, surgical history, family history, social history, and previous encounter notes.  I, Jackson Latino, RMA, am acting as Energy manager for [place], FNP.   I have reviewed the above documentation for accuracy and completeness, and I agree with the above. -  Jesse Sans, FNP

## 2021-08-02 DIAGNOSIS — F411 Generalized anxiety disorder: Secondary | ICD-10-CM | POA: Diagnosis not present

## 2021-08-02 DIAGNOSIS — F329 Major depressive disorder, single episode, unspecified: Secondary | ICD-10-CM | POA: Diagnosis not present

## 2021-08-23 ENCOUNTER — Encounter (INDEPENDENT_AMBULATORY_CARE_PROVIDER_SITE_OTHER): Payer: Self-pay | Admitting: Family Medicine

## 2021-08-23 ENCOUNTER — Other Ambulatory Visit: Payer: Self-pay

## 2021-08-23 ENCOUNTER — Ambulatory Visit (INDEPENDENT_AMBULATORY_CARE_PROVIDER_SITE_OTHER): Payer: BC Managed Care – PPO | Admitting: Family Medicine

## 2021-08-23 VITALS — BP 117/70 | HR 66 | Temp 97.5°F | Ht 63.0 in | Wt 154.0 lb

## 2021-08-23 DIAGNOSIS — E559 Vitamin D deficiency, unspecified: Secondary | ICD-10-CM

## 2021-08-23 DIAGNOSIS — E88819 Insulin resistance, unspecified: Secondary | ICD-10-CM

## 2021-08-23 DIAGNOSIS — E8881 Metabolic syndrome: Secondary | ICD-10-CM | POA: Diagnosis not present

## 2021-08-23 DIAGNOSIS — Z6838 Body mass index (BMI) 38.0-38.9, adult: Secondary | ICD-10-CM

## 2021-08-23 MED ORDER — VITAMIN D (ERGOCALCIFEROL) 1.25 MG (50000 UNIT) PO CAPS: 50000.0000 [IU] | ORAL_CAPSULE | ORAL | 0 refills | Status: DC

## 2021-08-23 MED ORDER — OZEMPIC (0.25 OR 0.5 MG/DOSE) 2 MG/1.5ML ~~LOC~~ SOPN: 0.5000 mg | PEN_INJECTOR | SUBCUTANEOUS | 0 refills | Status: DC

## 2021-08-23 NOTE — Progress Notes (Signed)
Chief Complaint:   OBESITY Rebecca Wood is here to discuss her progress with her obesity treatment plan along with follow-up of her obesity related diagnoses. Rebecca Wood is on the Category 3 Plan and keeping a food journal and adhering to recommended goals of 1500-1600 calories and 100 grams of protein and states she is following her eating plan approximately 70% of the time. Rebecca Wood states she is doing 0 minutes 0 times per week.  Today's visit was #: 48 Starting weight: 218 lbs Starting date: 10/20/2020 Today's weight: 154 lbs Today's date: 08/23/2021 Total lbs lost to date: 64 lbs Total lbs lost since last in-office visit: 5 lbs  Interim History: Rebecca Wood has met her 1st weight goal of 150-155 lbs. Her new goal is 145-150 lbs (25-26 BMI). She has done exceptionally well on plan and is down a total of 64 lbs. She does some walking for exercise. She is journaling consistently.   Subjective:   1. Insulin resistance Rebecca Wood's last A1C was normal at 5.3. She is on Ozempic 0.5 mg and feels it helps quite a bit with appetite.  Lab Results  Component Value Date   INSULIN 5.7 10/20/2020   Lab Results  Component Value Date   HGBA1C 5.3 10/20/2020    2. Vitamin D deficiency Rebecca Wood's Vitamin D is low at 27.4. She is on weekly prescription Vitamin D.   Lab Results  Component Value Date   VD25OH 34.8 03/08/2021   VD25OH 27.4 (L) 10/20/2020   VD25OH 26 (L) 01/09/2013    Assessment/Plan:   1. Insulin resistance We will refill Ozempic 0.5 mg weekly.  - Semaglutide,0.25 or 0.5MG/DOS, (OZEMPIC, 0.25 OR 0.5 MG/DOSE,) 2 MG/1.5ML SOPN; Inject 0.5 mg into the skin once a week.  Dispense: 1.5 mL; Refill: 0  2. Vitamin D deficiency We will refill prescription Vitamin D 50,000 IU every week and Rebecca Wood will follow-up for routine testing of Vitamin D, at least 2-3 times per year to avoid over-replacement. We will check Vitamin D at next office visit.   - Vitamin D, Ergocalciferol, (DRISDOL) 1.25 MG (50000  UNIT) CAPS capsule; Take 1 capsule (50,000 Units total) by mouth every 7 (seven) days.  Dispense: 4 capsule; Refill: 0  3. Overweight: Current BMI 27.29 Rebecca Wood is currently in the action stage of change. As such, her goal is to continue with weight loss efforts. She has agreed to keeping a food journal and adhering to recommended goals of 1500-1600 calories and 100 grams of protein daily.  We will check Thiamine and Vitamin D at next office visit.  Exercise goals: All adults should avoid inactivity. Some physical activity is better than none, and adults who participate in any amount of physical activity gain some health benefits.  Behavioral modification strategies: planning for success.  Rebecca Wood has agreed to follow-up with our clinic in 4 weeks.  Objective:   Blood pressure 117/70, pulse 66, temperature (!) 97.5 F (36.4 C), height 5' 3" (1.6 m), weight 154 lb (69.9 kg), SpO2 98 %. Body mass index is 27.28 kg/m.  General: Cooperative, alert, well developed, in no acute distress. HEENT: Conjunctivae and lids unremarkable. Cardiovascular: Regular rhythm.  Lungs: Normal work of breathing. Neurologic: No focal deficits.   Lab Results  Component Value Date   CREATININE 0.7 06/08/2021   BUN 10 06/08/2021   NA 136 (A) 06/08/2021   K 4.0 06/08/2021   CL 98 (A) 06/08/2021   CO2 30 (A) 06/08/2021   Lab Results  Component Value Date  ALT 33 06/08/2021   AST 28 06/08/2021   ALKPHOS 56 06/08/2021   BILITOT 1.0 10/20/2020   Lab Results  Component Value Date   HGBA1C 5.3 10/20/2020   Lab Results  Component Value Date   INSULIN 5.7 10/20/2020   Lab Results  Component Value Date   TSH 1.870 10/20/2020   Lab Results  Component Value Date   CHOL 208 (H) 10/20/2020   HDL 89 10/20/2020   LDLCALC 97 10/20/2020   TRIG 129 10/20/2020   CHOLHDL 2.3 10/20/2020   Lab Results  Component Value Date   VD25OH 34.8 03/08/2021   VD25OH 27.4 (L) 10/20/2020   VD25OH 26 (L) 01/09/2013    Lab Results  Component Value Date   WBC 6.1 06/08/2021   HGB 13.0 06/08/2021   HCT 39 06/08/2021   MCV 90 10/20/2020   PLT 466 (A) 06/08/2021   No results found for: IRON, TIBC, FERRITIN  Attestation Statements:   Reviewed by clinician on day of visit: allergies, medications, problem list, medical history, surgical history, family history, social history, and previous encounter notes.  I, Alisa White, RMA, am acting as transcriptionist for Dawn Whitmire, FNP.   I have reviewed the above documentation for accuracy and completeness, and I agree with the above. -  Dawn W Whitmire, FNP   

## 2021-09-06 ENCOUNTER — Encounter (INDEPENDENT_AMBULATORY_CARE_PROVIDER_SITE_OTHER): Payer: Self-pay | Admitting: Family Medicine

## 2021-09-20 ENCOUNTER — Ambulatory Visit (INDEPENDENT_AMBULATORY_CARE_PROVIDER_SITE_OTHER): Payer: BC Managed Care – PPO | Admitting: Family Medicine

## 2021-09-20 ENCOUNTER — Other Ambulatory Visit: Payer: Self-pay

## 2021-09-20 ENCOUNTER — Encounter (INDEPENDENT_AMBULATORY_CARE_PROVIDER_SITE_OTHER): Payer: Self-pay | Admitting: Family Medicine

## 2021-09-20 VITALS — BP 114/66 | HR 76 | Temp 97.7°F | Ht 63.0 in | Wt 151.0 lb

## 2021-09-20 DIAGNOSIS — E8881 Metabolic syndrome: Secondary | ICD-10-CM | POA: Diagnosis not present

## 2021-09-20 DIAGNOSIS — Z6838 Body mass index (BMI) 38.0-38.9, adult: Secondary | ICD-10-CM | POA: Diagnosis not present

## 2021-09-20 DIAGNOSIS — E559 Vitamin D deficiency, unspecified: Secondary | ICD-10-CM | POA: Diagnosis not present

## 2021-09-20 MED ORDER — OZEMPIC (0.25 OR 0.5 MG/DOSE) 2 MG/1.5ML ~~LOC~~ SOPN: 0.5000 mg | PEN_INJECTOR | SUBCUTANEOUS | 0 refills | Status: DC

## 2021-09-20 NOTE — Progress Notes (Signed)
Chief Complaint:   OBESITY Rebecca Wood is here to discuss her progress with her obesity treatment plan along with follow-up of her obesity related diagnoses. Rebecca Wood is on keeping a food journal and adhering to recommended goals of 1500-1600 calories and 100 grams of protein and states she is following her eating plan approximately 70% of the time. Rebecca Wood states she is doing 0 minutes 0 times per week.  Today's visit was #: 17 Starting weight: 218 lbs Starting date: 10/20/2020 Today's weight: 151 lbs Today's date: 09/20/2021 Total lbs lost to date: 67 lbs Total lbs lost since last in-office visit: 3 lbs  Interim History: Rebecca Wood is nearly at her goal of 145-150 lbs. She is 151 lbs today. She has done well despite recent holidays and a vacation at the beach. She is meeting protein goal most days. 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  Subjective:   1. Insulin resistance Rebecca Wood states Ozempic is working well for appetite suppression. She is tolerating Ozempic well.  Lab Results  Component Value Date   INSULIN 5.7 10/20/2020   Lab Results  Component Value Date   HGBA1C 5.3 10/20/2020    2. Vitamin D deficiency Rebecca Wood's Vitamin D is low at 34.8. She is on weekly prescription Vitamin D.  Lab Results  Component Value Date   VD25OH 34.8 03/08/2021   VD25OH 27.4 (L) 10/20/2020   VD25OH 26 (L) 01/09/2013    Assessment/Plan:   1. Insulin resistance We will refill Ozempic 0.5 mg weekly.  - Semaglutide,0.25 or 0.5MG /DOS, (OZEMPIC, 0.25 OR 0.5 MG/DOSE,) 2 MG/1.5ML SOPN; Inject 0.5 mg into the skin once a week.  Dispense: 1.5 mL; Refill: 0  2. Vitamin D deficiency  Rebecca Wood agrees to continue to take prescription Vitamin D 50,000 IU every week and she will follow-up for routine testing of Vitamin D, at least 2-3 times per year to avoid over-replacement.  3. Overweight: Current BMI 26.76 Rebecca Wood is currently in the action stage of change. As such,  her goal is to continue with weight loss efforts. She has agreed to keeping a food journal and adhering to recommended goals of 1500-1600 calories and 100 grams of protein daily.   Rebecca Wood's goal weight is 145-150 lbs (25-26 BMI).  Exercise goals:  Rebecca Wood will work on getting back to exercise.  Behavioral modification strategies: planning for success.  Rebecca Wood has agreed to follow-up with our clinic in 4-5 weeks fasting.  Objective:   Blood pressure 114/66, pulse 76, temperature 97.7 F (36.5 C), height 5\' 3"  (1.6 m), weight 151 lb (68.5 kg), SpO2 96 %. Body mass index is 26.75 kg/m.  General: Cooperative, alert, well developed, in no acute distress. HEENT: Conjunctivae and lids unremarkable. Cardiovascular: Regular rhythm.  Lungs: Normal work of breathing. Neurologic: No focal deficits.   Lab Results  Component Value Date   CREATININE 0.7 06/08/2021   BUN 10 06/08/2021   NA 136 (A) 06/08/2021   K 4.0 06/08/2021   CL 98 (A) 06/08/2021   CO2 30 (A) 06/08/2021   Lab Results  Component Value Date   ALT 33 06/08/2021   AST 28 06/08/2021   ALKPHOS 56 06/08/2021   BILITOT 1.0 10/20/2020   Lab Results  Component Value Date   HGBA1C 5.3 10/20/2020   Lab Results  Component Value Date   INSULIN 5.7 10/20/2020   Lab Results  Component Value Date   TSH 1.870 10/20/2020   Lab Results  Component  Value Date   CHOL 208 (H) 10/20/2020   HDL 89 10/20/2020   LDLCALC 97 10/20/2020   TRIG 129 10/20/2020   CHOLHDL 2.3 10/20/2020   Lab Results  Component Value Date   VD25OH 34.8 03/08/2021   VD25OH 27.4 (L) 10/20/2020   VD25OH 26 (L) 01/09/2013   Lab Results  Component Value Date   WBC 6.1 06/08/2021   HGB 13.0 06/08/2021   HCT 39 06/08/2021   MCV 90 10/20/2020   PLT 466 (A) 06/08/2021   No results found for: IRON, TIBC, FERRITIN  Attestation Statements:   Reviewed by clinician on day of visit: allergies, medications, problem list, medical history, surgical history,  family history, social history, and previous encounter notes.  I, Jackson Latino, RMA, am acting as Energy manager for [place], FNP.   I have reviewed the above documentation for accuracy and completeness, and I agree with the above. -  Jesse Sans, FNP

## 2021-10-23 ENCOUNTER — Ambulatory Visit (INDEPENDENT_AMBULATORY_CARE_PROVIDER_SITE_OTHER): Payer: BC Managed Care – PPO | Admitting: Family Medicine

## 2021-10-23 ENCOUNTER — Other Ambulatory Visit: Payer: Self-pay

## 2021-10-23 ENCOUNTER — Encounter (INDEPENDENT_AMBULATORY_CARE_PROVIDER_SITE_OTHER): Payer: Self-pay | Admitting: Family Medicine

## 2021-10-23 VITALS — BP 124/74 | HR 74 | Temp 97.0°F | Ht 63.0 in | Wt 149.0 lb

## 2021-10-23 DIAGNOSIS — E559 Vitamin D deficiency, unspecified: Secondary | ICD-10-CM

## 2021-10-23 DIAGNOSIS — R7989 Other specified abnormal findings of blood chemistry: Secondary | ICD-10-CM

## 2021-10-23 DIAGNOSIS — Z6838 Body mass index (BMI) 38.0-38.9, adult: Secondary | ICD-10-CM

## 2021-10-23 DIAGNOSIS — R748 Abnormal levels of other serum enzymes: Secondary | ICD-10-CM

## 2021-10-23 DIAGNOSIS — E8881 Metabolic syndrome: Secondary | ICD-10-CM | POA: Diagnosis not present

## 2021-10-23 DIAGNOSIS — I10 Essential (primary) hypertension: Secondary | ICD-10-CM | POA: Diagnosis not present

## 2021-10-23 DIAGNOSIS — E88819 Insulin resistance, unspecified: Secondary | ICD-10-CM

## 2021-10-23 DIAGNOSIS — Z6826 Body mass index (BMI) 26.0-26.9, adult: Secondary | ICD-10-CM

## 2021-10-23 DIAGNOSIS — F411 Generalized anxiety disorder: Secondary | ICD-10-CM

## 2021-10-23 DIAGNOSIS — E669 Obesity, unspecified: Secondary | ICD-10-CM

## 2021-10-23 DIAGNOSIS — Z9884 Bariatric surgery status: Secondary | ICD-10-CM

## 2021-10-23 DIAGNOSIS — Z9189 Other specified personal risk factors, not elsewhere classified: Secondary | ICD-10-CM

## 2021-10-23 MED ORDER — OZEMPIC (0.25 OR 0.5 MG/DOSE) 2 MG/1.5ML ~~LOC~~ SOPN: 0.5000 mg | PEN_INJECTOR | SUBCUTANEOUS | 0 refills | Status: DC

## 2021-10-23 MED ORDER — VITAMIN D (ERGOCALCIFEROL) 1.25 MG (50000 UNIT) PO CAPS: 50000.0000 [IU] | ORAL_CAPSULE | ORAL | 0 refills | Status: DC

## 2021-10-25 NOTE — Progress Notes (Signed)
Chief Complaint:   OBESITY Jacqueli is here to discuss her progress with her obesity treatment plan along with follow-up of her obesity related diagnoses. Florice is on keeping a food journal and adhering to recommended goals of 1500-1600 calories and 100 grams of protein daily and states she is following her eating plan approximately 60% of the time. Amaziah states she is doing 0 minutes 0 times per week.  Today's visit was #: 18 Starting weight: 218 lbs Starting date: 10/20/2020 Today's weight: 149 lbs Today's date: 10/23/2021 Total lbs lost to date: 69 Total lbs lost since last in-office visit: 2  Interim History: Kinzlie is down 2 lbs since her last office visit. Whenever she eats off the plan, she practices portion control and makes smarter choices. She notes Ozempic is helping a lot with her hunger and portions.   Subjective:   1. Essential hypertension Ivannia's blood pressure is at goal today. She is on Norvasc and hydrochlorothiazide.  No s-e of complaints  BP Readings from Last 3 Encounters:  10/23/21 124/74  09/20/21 114/66  08/23/21 117/70   2. Elevated liver enzymes Willeen has a history of elevated liver enzymes. She denies abdominal pain or jaundice and has never been told of any liver problems in the past. She denies excessive alcohol intake.  3. Insulin resistance Tawyna has a diagnosis of insulin resistance based on her elevated fasting insulin level >5. She continues to work on diet and exercise to decrease her risk of diabetes.  4. GAD (generalized anxiety disorder) Kileen is still being treated by Psych nurse practitioner. She is taking currently taking Buspar. She doe not have a Veterinary surgeon.  5. Vitamin D deficiency Lizanne is currently taking prescription vitamin D 50,000 IU each week. She denies nausea, vomiting or muscle weakness.  6. S/P gastric bypass Lyla is status post gastric bypass in January 2006.  7. At risk for impaired metabolic function Lolly is at  increased risk for impaired metabolic function due to insulin resistance.  Assessment/Plan:   Orders Placed This Encounter  Procedures   Hemoglobin A1c   Insulin, random   Comprehensive metabolic panel   Lipid panel   T4, free   TSH   VITAMIN D 25 Hydroxy (Vit-D Deficiency, Fractures)   Vitamin B1   Folate   Iron and TIBC   Ferritin   Vitamin B12    Medications Discontinued During This Encounter  Medication Reason   Vitamin D, Ergocalciferol, (DRISDOL) 1.25 MG (50000 UNIT) CAPS capsule Reorder   Semaglutide,0.25 or 0.5MG /DOS, (OZEMPIC, 0.25 OR 0.5 MG/DOSE,) 2 MG/1.5ML SOPN Reorder     Meds ordered this encounter  Medications   Semaglutide,0.25 or 0.5MG /DOS, (OZEMPIC, 0.25 OR 0.5 MG/DOSE,) 2 MG/1.5ML SOPN    Sig: Inject 0.5 mg into the skin once a week.    Dispense:  1.5 mL    Refill:  0   Vitamin D, Ergocalciferol, (DRISDOL) 1.25 MG (50000 UNIT) CAPS capsule    Sig: Take 1 capsule (50,000 Units total) by mouth every 7 (seven) days.    Dispense:  4 capsule    Refill:  0     1. Essential hypertension BP at goal today.  Continue meds. We will check labs today. Dameshia will continue her medications and work on increasing her exercise. We will watch for signs of hypotension as she continues her lifestyle modifications.  - Comprehensive metabolic panel - Lipid panel   2. Elevated liver enzymes We will check labs today, and will follow up  on results at Rashawn's next office visit.  Avoid etoh and continue prudent nutritional plan.  - Comprehensive metabolic panel   3. Insulin resistance We will check labs today. Kirstie will continue to work on weight loss, exercise, and decreasing simple carbohydrates to help decrease the risk of diabetes.  She denies a need for an increase in dose, and will We will refill Ozempic for 1 month. Ramsie agreed to follow-up with [place] as directed to closely monitor her progress.  - Semaglutide,0.25 or 0.5MG /DOS, (OZEMPIC, 0.25 OR 0.5 MG/DOSE,) 2  MG/1.5ML SOPN; Inject 0.5 mg into the skin once a week.  Dispense: 1.5 mL; Refill: 0 - Hemoglobin A1c - Insulin, random   4. GAD (generalized anxiety disorder) Behavior modification techniques were discussed today to help Marchetta deal with her anxiety, which is very well controlled currently.  When pt feels in control with her eating/life, she has less anxiety.  No need for counseling at this time. Orders and follow up as documented in patient record.    5. Vitamin D deficiency We will check labs today. We will refill prescription Vitamin D for 1 month. Marquia will follow-up for routine testing of Vitamin D, at least 2-3 times per year to avoid over-replacement.  - Vitamin D, Ergocalciferol, (DRISDOL) 1.25 MG (50000 UNIT) CAPS capsule; Take 1 capsule (50,000 Units total) by mouth every 7 (seven) days.  Dispense: 4 capsule; Refill: 0 - VITAMIN D 25 Hydroxy (Vit-D Deficiency, Fractures)   6. S/P gastric bypass We will check labs today to check for nutritional deficiencies. We will follow up at Takeria's next office visit.  - Vitamin B1 - Folate - Iron and TIBC - Ferritin - CBC - Vitamin B12   7. At risk for impaired metabolic function Aitanna was given approximately 9 minutes of impaired  metabolic function prevention counseling today. We discussed intensive lifestyle modifications today with an emphasis on specific nutrition and exercise instructions and strategies.   Repetitive spaced learning was employed today to elicit superior memory formation and behavioral change.  8. Obesity with current BMI of 26.5 Ahri is currently in the action stage of change. As such, her goal is to continue with weight loss efforts. She has agreed to keeping a food journal and adhering to recommended goals of 1500-1600 calories and 100 grams of protein daily.   Merilynn's goal weight is between 140-145 lbs now. She will get back to tracking and journaling, and will increase her activity.  Exercise goals: All  adults should avoid inactivity. Some physical activity is better than none, and adults who participate in any amount of physical activity gain some health benefits. Mirka will look into [employer] and get active again.  Behavioral modification strategies: planning for success and keeping a strict food journal.  Jazleen has agreed to follow-up with our clinic in 3-4 weeks with [facility], FNP-C and labs will need to be reviewed with pt at that OV.  She was informed of the importance of frequent follow-up visits to maximize her success with intensive lifestyle modifications for her multiple health conditions.   Yazlin was informed we would discuss her lab results at her next visit unless there is a critical issue that needs to be addressed sooner. Hannelore agreed to keep her next visit at the agreed upon time to discuss these results.   Objective:   Blood pressure 124/74, pulse 74, temperature (!) 97 F (36.1 C), height 5\' 3"  (1.6 m), weight 149 lb (67.6 kg), SpO2 99 %. Body mass  index is 26.39 kg/m.  General: Cooperative, alert, well developed, in no acute distress. HEENT: Conjunctivae and lids unremarkable. Cardiovascular: Regular rhythm.  Lungs: Normal work of breathing. Neurologic: No focal deficits.   Lab Results  Component Value Date   CREATININE 0.69 10/23/2021   BUN 10 10/23/2021   NA 139 10/23/2021   K 4.4 10/23/2021   CL 99 10/23/2021   CO2 25 10/23/2021   Lab Results  Component Value Date   ALT 35 (H) 10/23/2021   AST 39 10/23/2021   ALKPHOS 65 10/23/2021   BILITOT 0.8 10/23/2021   Lab Results  Component Value Date   HGBA1C 5.5 10/23/2021   HGBA1C 5.3 10/20/2020   Lab Results  Component Value Date   INSULIN 2.3 (L) 10/23/2021   INSULIN 5.7 10/20/2020   Lab Results  Component Value Date   TSH 1.800 10/23/2021   Lab Results  Component Value Date   CHOL 187 10/23/2021   HDL 73 10/23/2021   LDLCALC 97 10/23/2021   TRIG 94 10/23/2021   CHOLHDL 2.6  10/23/2021   Lab Results  Component Value Date   VD25OH 56.9 10/23/2021   VD25OH 34.8 03/08/2021   VD25OH 27.4 (L) 10/20/2020   Lab Results  Component Value Date   WBC 6.1 06/08/2021   HGB 13.0 06/08/2021   HCT 39 06/08/2021   MCV 90 10/20/2020   PLT 466 (A) 06/08/2021   Lab Results  Component Value Date   IRON 93 10/23/2021   TIBC 315 10/23/2021   FERRITIN 91 10/23/2021   Attestation Statements:   Reviewed by clinician on day of visit: allergies, medications, problem list, medical history, surgical history, family history, social history, and previous encounter notes.   Trude McburneyI, Sharon Martin, am acting as transcriptionist for Marsh & McLennanDeborah Azure Barrales, DO.  I have reviewed the above documentation for accuracy and completeness, and I agree with the above. Carlye Grippe-  Brisia Schuermann J Artesia Berkey, D.O.  The 21st Century Cures Act was signed into law in 2016 which includes the topic of electronic health records.  This provides immediate access to information in MyChart.  This includes consultation notes, operative notes, office notes, lab results and pathology reports.  If you have any questions about what you read please let us know at your next visit so we can discuss your concerns and take corrective action if need be.  We are right here with you.

## 2021-10-26 LAB — LIPID PANEL
Chol/HDL Ratio: 2.6 ratio (ref 0.0–4.4)
Cholesterol, Total: 187 mg/dL (ref 100–199)
HDL: 73 mg/dL (ref >39)
LDL Chol Calc (NIH): 97 mg/dL (ref 0–99)
Triglycerides: 94 mg/dL (ref 0–149)
VLDL Cholesterol Cal: 17 mg/dL (ref 5–40)

## 2021-10-26 LAB — FOLATE: Folate: 9.1 ng/mL (ref >3.0)

## 2021-10-26 LAB — COMPREHENSIVE METABOLIC PANEL
ALT: 35 IU/L — ABNORMAL HIGH (ref 0–32)
AST: 39 IU/L (ref 0–40)
Albumin/Globulin Ratio: 2 (ref 1.2–2.2)
Albumin: 4.4 g/dL (ref 3.8–4.9)
Alkaline Phosphatase: 65 IU/L (ref 44–121)
BUN/Creatinine Ratio: 14 (ref 9–23)
BUN: 10 mg/dL (ref 6–24)
Bilirubin Total: 0.8 mg/dL (ref 0.0–1.2)
CO2: 25 mmol/L (ref 20–29)
Calcium: 9.5 mg/dL (ref 8.7–10.2)
Chloride: 99 mmol/L (ref 96–106)
Creatinine, Ser: 0.69 mg/dL (ref 0.57–1.00)
Globulin, Total: 2.2 g/dL (ref 1.5–4.5)
Glucose: 78 mg/dL (ref 70–99)
Potassium: 4.4 mmol/L (ref 3.5–5.2)
Sodium: 139 mmol/L (ref 134–144)
Total Protein: 6.6 g/dL (ref 6.0–8.5)
eGFR: 101 mL/min/{1.73_m2} (ref >59)

## 2021-10-26 LAB — VITAMIN B1: Thiamine: 127.1 nmol/L (ref 66.5–200.0)

## 2021-10-26 LAB — VITAMIN B12: Vitamin B-12: 762 pg/mL (ref 232–1245)

## 2021-10-26 LAB — IRON AND TIBC
Iron Saturation: 30 % (ref 15–55)
Iron: 93 ug/dL (ref 27–159)
Total Iron Binding Capacity: 315 ug/dL (ref 250–450)
UIBC: 222 ug/dL (ref 131–425)

## 2021-10-26 LAB — FERRITIN: Ferritin: 91 ng/mL (ref 15–150)

## 2021-10-26 LAB — TSH: TSH: 1.8 u[IU]/mL (ref 0.450–4.500)

## 2021-10-26 LAB — INSULIN, RANDOM: INSULIN: 2.3 u[IU]/mL — ABNORMAL LOW (ref 2.6–24.9)

## 2021-10-26 LAB — T4, FREE: Free T4: 1.19 ng/dL (ref 0.82–1.77)

## 2021-10-26 LAB — HEMOGLOBIN A1C
Est. average glucose Bld gHb Est-mCnc: 111 mg/dL
Hgb A1c MFr Bld: 5.5 % (ref 4.8–5.6)

## 2021-10-26 LAB — VITAMIN D 25 HYDROXY (VIT D DEFICIENCY, FRACTURES): Vit D, 25-Hydroxy: 56.9 ng/mL (ref 30.0–100.0)

## 2021-11-20 ENCOUNTER — Encounter (INDEPENDENT_AMBULATORY_CARE_PROVIDER_SITE_OTHER): Payer: Self-pay | Admitting: Family Medicine

## 2021-11-20 ENCOUNTER — Other Ambulatory Visit: Payer: Self-pay

## 2021-11-20 ENCOUNTER — Ambulatory Visit (INDEPENDENT_AMBULATORY_CARE_PROVIDER_SITE_OTHER): Payer: BC Managed Care – PPO | Admitting: Family Medicine

## 2021-11-20 VITALS — BP 121/68 | HR 68 | Temp 97.8°F | Ht 63.0 in | Wt 146.0 lb

## 2021-11-20 DIAGNOSIS — E559 Vitamin D deficiency, unspecified: Secondary | ICD-10-CM | POA: Diagnosis not present

## 2021-11-20 DIAGNOSIS — Z6825 Body mass index (BMI) 25.0-25.9, adult: Secondary | ICD-10-CM | POA: Diagnosis not present

## 2021-11-20 DIAGNOSIS — Z6838 Body mass index (BMI) 38.0-38.9, adult: Secondary | ICD-10-CM

## 2021-11-20 DIAGNOSIS — E8881 Metabolic syndrome: Secondary | ICD-10-CM | POA: Diagnosis not present

## 2021-11-20 DIAGNOSIS — E669 Obesity, unspecified: Secondary | ICD-10-CM | POA: Diagnosis not present

## 2021-11-20 MED ORDER — VITAMIN D (ERGOCALCIFEROL) 1.25 MG (50000 UNIT) PO CAPS: 50000.0000 [IU] | ORAL_CAPSULE | ORAL | 0 refills | Status: DC

## 2021-11-20 MED ORDER — OZEMPIC (0.25 OR 0.5 MG/DOSE) 2 MG/1.5ML ~~LOC~~ SOPN: 0.5000 mg | PEN_INJECTOR | SUBCUTANEOUS | 0 refills | Status: DC

## 2021-11-20 NOTE — Progress Notes (Signed)
Chief Complaint:   OBESITY Rebecca Wood is here to discuss her progress with her obesity treatment plan along with follow-up of her obesity related diagnoses. Rebecca Wood is on keeping a food journal and adhering to recommended goals of 1500-1600 calories and 100 grams  protein and states she is following her eating plan approximately 60% of the time. Rebecca Wood states she is doing 0 minutes 0 times per week.  Today's visit was #: 19 Starting weight: 218 lbs Starting date: 10/20/2020 Today's weight: 146 lbs Today's date: 11/20/2021 Total lbs lost to date: 72 lbs Total lbs lost since last in-office visit: 3 lbs  Interim History: Rebecca Wood is 1 lb away from goal weight of 140-145 lbs. She has not been journaling consistently and is not exercising.  She says that she has had more carbohydrates recently. However she still lost 3 pounds.  She has lost 72 lbs since last January.  She is status post sleeve gastric bypass by Dr. Clent Ridges at [facility] in 2006.  Her high weight was 268 pounds, low weight was 135 pounds.  Subjective:   1. Insulin resistance [name] is on currently on Ozempic 0.5 mg weekly. Her appetite is well controlled.  She is tolerating it well.  Lab Results  Component Value Date   HGBA1C 5.5 10/23/2021   Lab Results  Component Value Date   INSULIN 2.3 (L) 10/23/2021   INSULIN 5.7 10/20/2020    2. Vitamin D deficiency [name] is on Vitamin D currently.  Lab Results  Component Value Date   VD25OH 56.9 10/23/2021   VD25OH 34.8 03/08/2021   VD25OH 27.4 (L) 10/20/2020    Assessment/Plan:   1. Insulin resistance We will refill Ozempic 0.5 mg weekly.  - Semaglutide,0.25 or 0.5MG /DOS, (OZEMPIC, 0.25 OR 0.5 MG/DOSE,) 2 MG/1.5ML SOPN; Inject 0.5 mg into the skin once a week.  Dispense: 1.5 mL; Refill: 0  2. Vitamin D deficiency  We will refill prescription Vitamin D 50,000 IU weekly and Chala will follow-up for routine testing of Vitamin D, at least 2-3 times per year to avoid  over-replacement.  - Vitamin D, Ergocalciferol, (DRISDOL) 1.25 MG (50000 UNIT) CAPS capsule; Take 1 capsule (50,000 Units total) by mouth every 7 (seven) days.  Dispense: 4 capsule; Refill: 0  3. Obesity with current BMI of 25.87 Rebecca Wood is currently in the action stage of change. As such, her goal is to continue with weight loss efforts. She has agreed to keeping a food journal and adhering to recommended goals of 1500-1600 calories and 100 grams of protein daily.  Exercise goals:  Rebecca Wood will use resistance bands 2 times per walk. She will walk 30 minutes 3 times per week.  Behavioral modification strategies: keeping a strict food journal.  Rebecca Wood has agreed to follow-up with our clinic in 4 weeks.  Objective:   Blood pressure 121/68, pulse 68, temperature 97.8 F (36.6 C), height 5\' 3"  (1.6 m), weight 146 lb (66.2 kg), SpO2 99 %. Body mass index is 25.86 kg/m.  General: Cooperative, alert, well developed, in no acute distress. HEENT: Conjunctivae and lids unremarkable. Cardiovascular: Regular rhythm.  Lungs: Normal work of breathing. Neurologic: No focal deficits.   Lab Results  Component Value Date   CREATININE 0.69 10/23/2021   BUN 10 10/23/2021   NA 139 10/23/2021   K 4.4 10/23/2021   CL 99 10/23/2021   CO2 25 10/23/2021   Lab Results  Component Value Date   ALT 35 (H) 10/23/2021   AST 39 10/23/2021  ALKPHOS 65 10/23/2021   BILITOT 0.8 10/23/2021   Lab Results  Component Value Date   HGBA1C 5.5 10/23/2021   HGBA1C 5.3 10/20/2020   Lab Results  Component Value Date   INSULIN 2.3 (L) 10/23/2021   INSULIN 5.7 10/20/2020   Lab Results  Component Value Date   TSH 1.800 10/23/2021   Lab Results  Component Value Date   CHOL 187 10/23/2021   HDL 73 10/23/2021   LDLCALC 97 10/23/2021   TRIG 94 10/23/2021   CHOLHDL 2.6 10/23/2021   Lab Results  Component Value Date   VD25OH 56.9 10/23/2021   VD25OH 34.8 03/08/2021   VD25OH 27.4 (L) 10/20/2020   Lab  Results  Component Value Date   WBC 6.1 06/08/2021   HGB 13.0 06/08/2021   HCT 39 06/08/2021   MCV 90 10/20/2020   PLT 466 (A) 06/08/2021   Lab Results  Component Value Date   IRON 93 10/23/2021   TIBC 315 10/23/2021   FERRITIN 91 10/23/2021   Attestation Statements:   Reviewed by clinician on day of visit: allergies, medications, problem list, medical history, surgical history, family history, social history, and previous encounter notes.  I, Jackson Latino, RMA, am acting as Energy manager for [place], FNP.  I have reviewed the above documentation for accuracy and completeness, and I agree with the above. -  Jesse Sans, FNP

## 2021-12-20 ENCOUNTER — Other Ambulatory Visit: Payer: Self-pay

## 2021-12-20 ENCOUNTER — Encounter (INDEPENDENT_AMBULATORY_CARE_PROVIDER_SITE_OTHER): Payer: Self-pay | Admitting: Physician Assistant

## 2021-12-20 ENCOUNTER — Ambulatory Visit (INDEPENDENT_AMBULATORY_CARE_PROVIDER_SITE_OTHER): Payer: BC Managed Care – PPO | Admitting: Physician Assistant

## 2021-12-20 VITALS — BP 113/71 | HR 71 | Temp 97.8°F | Ht 63.0 in | Wt 144.0 lb

## 2021-12-20 DIAGNOSIS — E559 Vitamin D deficiency, unspecified: Secondary | ICD-10-CM

## 2021-12-20 DIAGNOSIS — M25512 Pain in left shoulder: Secondary | ICD-10-CM | POA: Diagnosis not present

## 2021-12-20 DIAGNOSIS — E8881 Metabolic syndrome: Secondary | ICD-10-CM | POA: Diagnosis not present

## 2021-12-20 DIAGNOSIS — E669 Obesity, unspecified: Secondary | ICD-10-CM

## 2021-12-20 DIAGNOSIS — Z9189 Other specified personal risk factors, not elsewhere classified: Secondary | ICD-10-CM

## 2021-12-20 DIAGNOSIS — Z6825 Body mass index (BMI) 25.0-25.9, adult: Secondary | ICD-10-CM

## 2021-12-20 DIAGNOSIS — Z6838 Body mass index (BMI) 38.0-38.9, adult: Secondary | ICD-10-CM

## 2021-12-20 DIAGNOSIS — M25511 Pain in right shoulder: Secondary | ICD-10-CM

## 2021-12-20 MED ORDER — VITAMIN D (ERGOCALCIFEROL) 1.25 MG (50000 UNIT) PO CAPS: 50000.0000 [IU] | ORAL_CAPSULE | ORAL | 0 refills | Status: DC

## 2021-12-20 MED ORDER — OZEMPIC (0.25 OR 0.5 MG/DOSE) 2 MG/1.5ML ~~LOC~~ SOPN: 0.5000 mg | PEN_INJECTOR | SUBCUTANEOUS | 0 refills | Status: DC

## 2021-12-20 NOTE — Progress Notes (Signed)
? ? ? ?Chief Complaint:  ? ?OBESITY ?Rebecca Wood is here to discuss her progress with her obesity treatment plan along with follow-up of her obesity related diagnoses. Rebecca Wood is on keeping a food journal and adhering to recommended goals of 1500-1600 calories and 100 grams of protein and states she is following her eating plan approximately 50% of the time. Rebecca Wood states she is doing 0 minutes 0 times per week. ? ?Today's visit was #: 20 ?Starting weight: 218 lbs ?Starting date: 10/20/2020 ?Today's weight: 144 lbs ?Today's date: 12/20/2021 ?Total lbs lost to date: 74 lbs ?Total lbs lost since last in-office visit: 2 lbs ? ?Interim History: Rebecca Wood reports that she has not had a good last 2 weeks with eating because of work. She has been eating "on the run" a lot more often. ? ?Subjective:  ? ?1. Insulin resistance ?Rebecca Wood is currently on Ozempic 0.5 mg. Her appetite is controlled. Her last A1C was 5.5. Her insulin was 2.3. ? ?2. Vitamin D deficiency ?Rebecca Wood is on Vitamin D weekly and she is tolerating it well.  ? ?3. Bilateral shoulder pain, unspecified chronicity ?Rebecca Wood notes decreased range of motion in both shoulders. She want sot start lifting weights. Her pain can be very intense when reaching out or trying to clamp her bra.  ? ?4. At risk for activity intolerance ?Rebecca Wood is at risk for activity intolerance due to starting a new exercise regime.  ? ?Assessment/Plan:  ? ?1. Insulin resistance ?We will refill Ozempic 0.5 mg for 1 month with no refills. Rebecca Wood will continue to work on weight loss, exercise, and decreasing simple carbohydrates to help decrease the risk of diabetes. Rebecca Wood agreed to follow-up with [place] as directed to closely monitor her progress. ? ?- Semaglutide,0.25 or 0.5MG /DOS, (OZEMPIC, 0.25 OR 0.5 MG/DOSE,) 2 MG/1.5ML SOPN; Inject 0.5 mg into the skin once a week.  Dispense: 1.5 mL; Refill: 0 ? ?2. Vitamin D deficiency ?Low Vitamin D level contributes to fatigue and are associated with obesity, breast, and colon  cancer. We will refill prescription Vitamin D 50,000 IU every week for 1 month with no refills and Rebecca Wood will follow-up for routine testing of Vitamin D, at least 2-3 times per year to avoid over-replacement. ? ?- Vitamin D, Ergocalciferol, (DRISDOL) 1.25 MG (50000 UNIT) CAPS capsule; Take 1 capsule (50,000 Units total) by mouth every 7 (seven) days.  Dispense: 4 capsule; Refill: 0 ? ?3. Bilateral shoulder pain, unspecified chronicity ?Rebecca Wood was referred to Rebecca Wood and for physical therapy.  ? ?4. At risk for activity intolerance ?Rebecca Wood was given approximately 15 minutes of exercise intolerance counseling today. She is 56 y.o. female and has risk factors exercise intolerance including obesity. We discussed intensive lifestyle modifications today with an emphasis on specific weight loss instructions and strategies. Rebecca Wood will slowly increase activity as tolerated. ? ?Repetitive spaced learning was employed today to elicit superior memory formation and behavioral change.  ? ?5. Obesity with current BMI of 25.51 ?Rebecca Wood is currently in the action stage of change. As such, her goal is to continue with weight loss efforts. She has agreed to keeping a food journal and adhering to recommended goals of 1500-1600 calories and 100 grams of protein daily.  ? ?Exercise goals: No exercise has been prescribed at this time. ? ?Behavioral modification strategies: meal planning and cooking strategies. ? ?Rebecca Wood has agreed to follow-up with our clinic in 4 weeks. She was informed of the importance of frequent follow-up visits to maximize her success with intensive lifestyle modifications  for her multiple health conditions.  ? ?Objective:  ? ?Blood pressure 113/71, pulse 71, temperature 97.8 ?F (36.6 ?C), height 5\' 3"  (1.6 m), weight 144 lb (65.3 kg), SpO2 99 %. ?Body mass index is 25.51 kg/m?. ? ?General: Cooperative, alert, well developed, in no acute distress. ?HEENT: Conjunctivae and lids unremarkable. ?Cardiovascular: Regular  rhythm.  ?Lungs: Normal work of breathing. ?Neurologic: No focal deficits.  ? ?Lab Results  ?Component Value Date  ? CREATININE 0.69 10/23/2021  ? BUN 10 10/23/2021  ? NA 139 10/23/2021  ? K 4.4 10/23/2021  ? CL 99 10/23/2021  ? CO2 25 10/23/2021  ? ?Lab Results  ?Component Value Date  ? ALT 35 (H) 10/23/2021  ? AST 39 10/23/2021  ? ALKPHOS 65 10/23/2021  ? BILITOT 0.8 10/23/2021  ? ?Lab Results  ?Component Value Date  ? HGBA1C 5.5 10/23/2021  ? HGBA1C 5.3 10/20/2020  ? ?Lab Results  ?Component Value Date  ? INSULIN 2.3 (L) 10/23/2021  ? INSULIN 5.7 10/20/2020  ? ?Lab Results  ?Component Value Date  ? TSH 1.800 10/23/2021  ? ?Lab Results  ?Component Value Date  ? CHOL 187 10/23/2021  ? HDL 73 10/23/2021  ? LDLCALC 97 10/23/2021  ? TRIG 94 10/23/2021  ? CHOLHDL 2.6 10/23/2021  ? ?Lab Results  ?Component Value Date  ? VD25OH 56.9 10/23/2021  ? VD25OH 34.8 03/08/2021  ? VD25OH 27.4 (L) 10/20/2020  ? ?Lab Results  ?Component Value Date  ? WBC 6.1 06/08/2021  ? HGB 13.0 06/08/2021  ? HCT 39 06/08/2021  ? MCV 90 10/20/2020  ? PLT 466 (A) 06/08/2021  ? ?Lab Results  ?Component Value Date  ? IRON 93 10/23/2021  ? TIBC 315 10/23/2021  ? FERRITIN 91 10/23/2021  ? ?Attestation Statements:  ? ?Reviewed by clinician on day of visit: allergies, medications, problem list, medical history, surgical history, family history, social history, and previous encounter notes. ? ?I, 12/21/2021, am acting as Sindy Messing for Energy manager, PA-C. ? ?I have reviewed the above documentation for accuracy and completeness, and I agree with the above. [employer], PA-C ? ?

## 2021-12-22 NOTE — Progress Notes (Unsigned)
[place] Phone: [phone] Subjective:    I'm seeing this patient by the request  of:  [name], MD  CC:   [id number]  Rebecca Wood is a 56 y.o. female coming in with complaint of B shoulder pain. Worse when trying to put on bra. Patient states       Past Medical History:  Diagnosis Date   Anxiety    B12 deficiency    Back pain    Chest pain    Depression    Heartburn    Hx of blood clots    Hypertension, essential    Joint pain    Lactose intolerance    Obesity    PMDD (premenstrual dysphoric disorder)    SOBOE (shortness of breath on exertion)    Swelling of both lower extremities    Vitamin D deficiency    Past Surgical History:  Procedure Laterality Date   GASTRIC BYPASS  2006   Social History   Socioeconomic History   Marital status: Married    Spouse name: Astoria Askari   Number of children: Not on file   Years of education: Not on file   Highest education level: Not on file  Occupational History   Occupation: Cabin crew in [employer]  Tobacco Use   Smoking status: Never   Smokeless tobacco: Never  Substance and Sexual Activity   Alcohol use: Yes    Comment: occasional liquor   Drug use: No   Sexual activity: Yes    Birth control/protection: Surgical    Comment: VAS  Other Topics Concern   Not on file  Social History Narrative   Not on file   Social Determinants of Health   Financial Resource Strain: Not on file  Food Insecurity: Not on file  Transportation Needs: Not on file  Physical Activity: Not on file  Stress: Not on file  Social Connections: Not on file   Allergies  Allergen Reactions   Codeine    Family History  Problem Relation Age of Onset   Cancer Mother 28       kidney   Diabetes Mother    Hypertension Mother    Kidney disease Mother    Thyroid disease Mother    Kidney cancer Mother    Anxiety disorder Mother    Obesity Mother    Heart  attack Father 90   Heart disease Father    Depression Brother    Heart attack Brother    Heart disease Brother     Current Outpatient Medications (Endocrine & Metabolic):    0000000 or 0.5MG /DOS, (OZEMPIC, 0.25 OR 0.5 MG/DOSE,) 2 MG/1.5ML SOPN, Inject 0.5 mg into the skin once a week.  Current Outpatient Medications (Cardiovascular):    amLODipine (NORVASC) 10 MG tablet, Take 10 mg by mouth daily.   hydrochlorothiazide (HYDRODIURIL) 25 MG tablet, Take 25 mg by mouth daily.  Current Outpatient Medications (Respiratory):    Loratadine 10 MG CAPS, Take 10 mg by mouth daily.  Current Outpatient Medications (Analgesics):    ibuprofen (ADVIL) 600 MG tablet, Take 1 tablet (600 mg total) by mouth every 6 (six) hours as needed.   Current Outpatient Medications (Other):    busPIRone (BUSPAR) 10 MG tablet, Take 10 mg by mouth 2 (two) times daily.   Methylcellulose, Laxative, (CITRUCEL PO), Take 2 tablets by mouth daily.    Multiple Vitamin (MULTIVITAMIN) tablet, Take 1 tablet by mouth daily.    omeprazole (PRILOSEC) 40  MG capsule, Take 40 mg by mouth daily.   Vitamin D, Ergocalciferol, (DRISDOL) 1.25 MG (50000 UNIT) CAPS capsule, Take 1 capsule (50,000 Units total) by mouth every 7 (seven) days.   Reviewed prior external information including notes and imaging from  primary care provider As well as notes that were available from care everywhere and other healthcare systems.  Past medical history, social, surgical and family history all reviewed in electronic medical record.  No pertanent information unless stated regarding to the chief complaint.   Review of Systems:  No headache, visual changes, nausea, vomiting, diarrhea, constipation, dizziness, abdominal pain, skin rash, fevers, chills, night sweats, weight loss, swollen lymph nodes, body aches, joint swelling, chest pain, shortness of breath, mood changes. POSITIVE muscle aches  Objective  There were no vitals taken for this  visit.   General: No apparent distress alert and oriented x3 mood and affect normal, dressed appropriately.  HEENT: Pupils equal, extraocular movements intact  Respiratory: Patient's speak in full sentences and does not appear short of breath  Cardiovascular: No lower extremity edema, non tender, no erythema  Gait normal with good balance and coordination.  MSK:  Non tender with full range of motion and good stability and symmetric strength and tone of shoulders, elbows, wrist, hip, knee and ankles bilaterally.     Impression and Recommendations:     The above documentation has been reviewed and is accurate and complete Jacqualin Combes

## 2021-12-26 ENCOUNTER — Ambulatory Visit (INDEPENDENT_AMBULATORY_CARE_PROVIDER_SITE_OTHER): Payer: BC Managed Care – PPO

## 2021-12-26 ENCOUNTER — Other Ambulatory Visit: Payer: Self-pay

## 2021-12-26 ENCOUNTER — Encounter: Payer: Self-pay | Admitting: Family Medicine

## 2021-12-26 ENCOUNTER — Ambulatory Visit (INDEPENDENT_AMBULATORY_CARE_PROVIDER_SITE_OTHER): Payer: BC Managed Care – PPO | Admitting: Family Medicine

## 2021-12-26 VITALS — BP 120/70 | HR 74 | Ht 63.0 in | Wt 151.0 lb

## 2021-12-26 DIAGNOSIS — M25511 Pain in right shoulder: Secondary | ICD-10-CM

## 2021-12-26 DIAGNOSIS — M75112 Incomplete rotator cuff tear or rupture of left shoulder, not specified as traumatic: Secondary | ICD-10-CM

## 2021-12-26 DIAGNOSIS — M75102 Unspecified rotator cuff tear or rupture of left shoulder, not specified as traumatic: Secondary | ICD-10-CM | POA: Insufficient documentation

## 2021-12-26 DIAGNOSIS — M25512 Pain in left shoulder: Secondary | ICD-10-CM

## 2021-12-26 DIAGNOSIS — M19019 Primary osteoarthritis, unspecified shoulder: Secondary | ICD-10-CM

## 2021-12-26 MED ORDER — NITROGLYCERIN 0.2 MG/HR TD PT24: 0.2000 mg | MEDICATED_PATCH | Freq: Every day | TRANSDERMAL | 0 refills | Status: DC

## 2021-12-26 NOTE — Patient Instructions (Addendum)
Good to see you  ?Exercises for rotator cuff ? ?Nitro patch sent in please follow direction below for usage  ?Nitroglycerin Protocol ? ?? Apply 1/4 nitroglycerin patch to affected area daily. ?? Change position of patch within the affected area every 24 hours. ?? You may experience a headache during the first 1-2 weeks of using the patch, these should subside. ?? If you experience headaches after beginning nitroglycerin patch treatment, you may take your preferred over the counter pain reliever. ?? Another side effect of the nitroglycerin patch is skin irritation or rash related to patch adhesive. ?? Please notify our office if you develop more severe headaches or rash, and stop the patch. ?? Tendon healing with nitroglycerin patch may require 12 to 24 weeks depending on the extent of injury. ?? Men should not use if taking Viagra, Cialis, or Levitra.  ?? Do not use if you have migraines or rosacea.  ? ? ?Tart cherry extract 1200mg  at night ?Ice 20 minutes 2 times daily. Usually after activity and before bed. ?X-ray on way out ?Follow up in 4-6 weeks  ? ? ? ?

## 2021-12-26 NOTE — Assessment & Plan Note (Signed)
Right-sided.  He unfortunately was unable to save the ultrasound pictures today.  Discussed icing regimen and home exercises, discussed keeping hands within peripheral vision.  We will get x-rays of the cervical spine to make sure that this is not playing a significant role.  Follow-up with me again in 6 to 8 weeks ?

## 2021-12-26 NOTE — Assessment & Plan Note (Signed)
Patient has lost a considerable amount of weight, has not been very active.  Discussed with patient that there is a intersubstance tearing noted of the supraspinatus.  Discussed home exercises, icing regimen, nitroglycerin and warned of potential side effects.  Increase activity slowly.  Follow-up again in 6 to 8 weeks ?

## 2022-01-15 NOTE — Progress Notes (Signed)
?TeleHealth Visit:  ?Due to the COVID-19 pandemic, this visit was completed with telemedicine (audio/video) technology to reduce patient and provider exposure as well as to preserve personal protective equipment.  ? ?Rebecca Wood has verbally consented to this TeleHealth visit. The patient is located at home, the provider is located at home. The participants in this visit include the listed provider and patient. The visit was conducted today via MyChart video. ? ?OBESITY ?Rebecca Wood is here to discuss her progress with her obesity treatment plan along with follow-up of her obesity related diagnoses.  ? ?Today's visit was # 21 ?Starting weight: 218 lbs ?Starting date: 10/20/20 ?Total weight loss: 74 lbs at last in office visit. ?Weight at last in office visit: 144 lbs ?Today's reported weight: 143.7 lbs  ? ?Nutrition Plan: keeping a food journal and adhering to recommended goals of 1500-1600 calories and 100 gms  protein daily. ?Hunger is well controlled. Cravings are well controlled.  ?Current exercise: none ? 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 ?She has done very well with maintaining her weight since the last office visit.  She is no longer journaling but following portion control smart choices.  She reports choosing healthy foods and having protein at all meals. ?Her goal weight is 140 to 145 pounds and she was 143 pounds today. ? ?Assessment/Plan:  ?1. Bilateral shoulder pain ?Mandie had been having severe bilateral shoulder pain and reduced range of motion.  She was referred to Dr. Ayesha Mohair at her last visit with Vernona Rieger, PA.  Rebecca Wood saw Dr. Katrinka Blazing and he did an ultrasound which showed severe acromioclavicular arthritis with effusion in her right shoulder and rotator cuff tear in her  left shoulder.  He instructed her on exercises to do to reduce pain and improve range of motion.  She has a follow-up with Dr. Katrinka Blazing.  They are trying to avoid surgery. ?I reviewed Dr. Michaelle Copas note from December 26, 2021. ? ?Plan: ?She will continue to do exercises as directed.  She will follow-up with Dr. Katrinka Blazing on April 18. ? ?2. Vitamin D Deficiency ?Vitamin D is at goal of 50. She is on weekly prescription Vitamin D 50,000 IU.  ?Lab Results  ?Component Value Date  ? VD25OH 56.9 10/23/2021  ? VD25OH 34.8 03/08/2021  ? VD25OH 27.4 (L) 10/20/2020  ? ? ?Plan: ?Refill prescription vitamin D 50,000 IU weekly. ? ?3. Insulin Resistance ?Neylan has had elevated fasting insulin readings. Goal is HgbA1c < 5.7, fasting insulin closer to 5.   ?She denies polyphagia.  ?Medication(s): Ozempic 0.5 mg weekly.  Tolerates well.  Has good appetite suppression. ?Lab Results  ?Component Value Date  ? HGBA1C 5.5 10/23/2021  ? ?Lab Results  ?Component Value Date  ? INSULIN 2.3 (L) 10/23/2021  ? INSULIN 5.7 10/20/2020  ? ? ?Plan ?Continue Ozempic at 0.5 mg weekly.  We may need to consider decreasing dose to 0.25 mg if weight does not remain stable. ? ?4. Obesity: Current BMI 25.34 ?Rebecca Wood is currently in the action stage of change. As such, her goal is to maintain weight for now. She has agreed to practicing portion control and making smarter food choices, such as increasing vegetables and decreasing simple carbohydrates.  ? ?Exercise goals:  Continue arm and shoulder exercises provided  by Dr. Katrinka Blazing.  Encouraged her to try to get in some walking on the weekends.   ? ?Behavioral modification strategies: planning for success. ? ?Rebecca Wood has agreed to follow-up with our clinic in 6 weeks.  ? ?No orders of the defined types were placed in this encounter. ? ? ?Medications Discontinued During This Encounter  ?Medication Reason  ? Semaglutide,0.25 or 0.5MG /DOS, (OZEMPIC, 0.25 OR 0.5 MG/DOSE,) 2 MG/1.5ML SOPN Reorder  ? Vitamin D, Ergocalciferol,  (DRISDOL) 1.25 MG (50000 UNIT) CAPS capsule Reorder  ?  ? ?Meds ordered this encounter  ?Medications  ? Semaglutide,0.25 or 0.5MG /DOS, (OZEMPIC, 0.25 OR 0.5 MG/DOSE,) 2 MG/1.5ML SOPN  ?  Sig: Inject 0.5 mg into the skin once a week.  ?  Dispense:  1.5 mL  ?  Refill:  0  ?  Order Specific Question:   Supervising Provider  ?  Answer:   Quillian Quince D [[id number]]  ? Vitamin D, Ergocalciferol, (DRISDOL) 1.25 MG (50000 UNIT) CAPS capsule  ?  Sig: Take 1 capsule (50,000 Units total) by mouth every 7 (seven) days.  ?  Dispense:  12 capsule  ?  Refill:  0  ?  Order Specific Question:   Supervising Provider  ?  Answer:   Quillian Quince D [[id number]]  ?   ? ?Objective:  ? ?VITALS: Per patient if applicable, see vitals. ?GENERAL: Alert and in no acute distress. ?CARDIOPULMONARY: No increased WOB. Speaking in clear sentences.  ?PSYCH: Pleasant and cooperative. Speech normal rate and rhythm. Affect is appropriate. Insight and judgement are appropriate. Attention is focused, linear, and appropriate.  ?NEURO: Oriented as arrived to appointment on time with no prompting.  ? ?Lab Results  ?Component Value Date  ? CREATININE 0.69 10/23/2021  ? BUN 10 10/23/2021  ? NA 139 10/23/2021  ? K 4.4 10/23/2021  ? CL 99 10/23/2021  ? CO2 25 10/23/2021  ? ?Lab Results  ?Component Value Date  ? ALT 35 (H) 10/23/2021  ? AST 39 10/23/2021  ? ALKPHOS 65 10/23/2021  ? BILITOT 0.8 10/23/2021  ? ?Lab Results  ?Component Value Date  ? HGBA1C 5.5 10/23/2021  ? HGBA1C 5.3 10/20/2020  ? ?Lab Results  ?Component Value Date  ? INSULIN 2.3 (L) 10/23/2021  ? INSULIN 5.7 10/20/2020  ? ?Lab Results  ?Component Value Date  ? TSH 1.800 10/23/2021  ? ?Lab Results  ?Component Value Date  ? CHOL 187 10/23/2021  ? HDL 73 10/23/2021  ? LDLCALC 97 10/23/2021  ? TRIG 94 10/23/2021  ? CHOLHDL 2.6 10/23/2021  ? ?Lab Results  ?Component Value Date  ? WBC 6.1 06/08/2021  ? HGB 13.0 06/08/2021  ? HCT 39 06/08/2021  ? MCV 90 10/20/2020  ? PLT 466 (A) 06/08/2021  ? ?Lab Results   ?Component Value Date  ? IRON 93 10/23/2021  ? TIBC 315 10/23/2021  ? FERRITIN 91 10/23/2021  ? ?Lab Results  ?Component Value Date  ? VD25OH 56.9 10/23/2021  ? VD25OH 34.8 03/08/2021  ? VD25OH 27.4 (L) 10/20/2020  ? ? ?Attestation Statements:  ? ?Reviewed by clinician on day of visit: allergies, medications, problem list, medical history, surgical history, family history, social history, and previous encounter notes. ? ? ?

## 2022-01-16 ENCOUNTER — Ambulatory Visit: Payer: Self-pay | Admitting: Family Medicine

## 2022-01-16 ENCOUNTER — Encounter (INDEPENDENT_AMBULATORY_CARE_PROVIDER_SITE_OTHER): Payer: Self-pay | Admitting: Family Medicine

## 2022-01-16 ENCOUNTER — Telehealth (INDEPENDENT_AMBULATORY_CARE_PROVIDER_SITE_OTHER): Payer: BC Managed Care – PPO | Admitting: Family Medicine

## 2022-01-16 VITALS — Ht 63.0 in | Wt 143.0 lb

## 2022-01-16 DIAGNOSIS — E8881 Metabolic syndrome: Secondary | ICD-10-CM | POA: Diagnosis not present

## 2022-01-16 DIAGNOSIS — E559 Vitamin D deficiency, unspecified: Secondary | ICD-10-CM

## 2022-01-16 DIAGNOSIS — M25511 Pain in right shoulder: Secondary | ICD-10-CM | POA: Diagnosis not present

## 2022-01-16 DIAGNOSIS — M25512 Pain in left shoulder: Secondary | ICD-10-CM | POA: Diagnosis not present

## 2022-01-16 DIAGNOSIS — E669 Obesity, unspecified: Secondary | ICD-10-CM

## 2022-01-16 DIAGNOSIS — E88819 Insulin resistance, unspecified: Secondary | ICD-10-CM

## 2022-01-16 DIAGNOSIS — Z6825 Body mass index (BMI) 25.0-25.9, adult: Secondary | ICD-10-CM

## 2022-01-16 MED ORDER — OZEMPIC (0.25 OR 0.5 MG/DOSE) 2 MG/1.5ML ~~LOC~~ SOPN: 0.5000 mg | PEN_INJECTOR | SUBCUTANEOUS | 0 refills | Status: DC

## 2022-01-16 MED ORDER — VITAMIN D (ERGOCALCIFEROL) 1.25 MG (50000 UNIT) PO CAPS: 50000.0000 [IU] | ORAL_CAPSULE | ORAL | 0 refills | Status: DC

## 2022-01-17 ENCOUNTER — Ambulatory Visit (INDEPENDENT_AMBULATORY_CARE_PROVIDER_SITE_OTHER): Payer: BC Managed Care – PPO | Admitting: Physician Assistant

## 2022-01-17 ENCOUNTER — Ambulatory Visit: Payer: Self-pay | Admitting: Family Medicine

## 2022-01-24 NOTE — Progress Notes (Deleted)
[place] Phone: [phone] Subjective:    I'm seeing this patient by the request  of:  [name], MD  CC:   [id number]  12/26/2021 Patient has lost a considerable amount of weight, has not been very active.  Discussed with patient that there is a intersubstance tearing noted of the supraspinatus.  Discussed home exercises, icing regimen, nitroglycerin and warned of potential side effects.  Increase activity slowly.  Follow-up again in 6 to 8 weeks  Update 01/30/2022 Rebecca Wood is a 56 y.o. female coming in with complaint of B shoulder pain. L RTC and R AC joint. Patient states        Past Medical History:  Diagnosis Date   Anxiety    B12 deficiency    Back pain    Chest pain    Depression    Heartburn    Hx of blood clots    Hypertension, essential    Joint pain    Lactose intolerance    Obesity    PMDD (premenstrual dysphoric disorder)    SOBOE (shortness of breath on exertion)    Swelling of both lower extremities    Vitamin D deficiency    Past Surgical History:  Procedure Laterality Date   GASTRIC BYPASS  2006   Social History   Socioeconomic History   Marital status: Married    Spouse name: Youstina Supplee   Number of children: Not on file   Years of education: Not on file   Highest education level: Not on file  Occupational History   Occupation: Cabin crew in [employer]  Tobacco Use   Smoking status: Never   Smokeless tobacco: Never  Substance and Sexual Activity   Alcohol use: Yes    Comment: occasional liquor   Drug use: No   Sexual activity: Yes    Birth control/protection: Surgical    Comment: VAS  Other Topics Concern   Not on file  Social History Narrative   Not on file   Social Determinants of Health   Financial Resource Strain: Not on file  Food Insecurity: Not on file  Transportation Needs: Not on file  Physical Activity: Not on file  Stress: Not on file   Social Connections: Not on file   Allergies  Allergen Reactions   Codeine    Family History  Problem Relation Age of Onset   Cancer Mother 6       kidney   Diabetes Mother    Hypertension Mother    Kidney disease Mother    Thyroid disease Mother    Kidney cancer Mother    Anxiety disorder Mother    Obesity Mother    Heart attack Father 13   Heart disease Father    Depression Brother    Heart attack Brother    Heart disease Brother     Current Outpatient Medications (Endocrine & Metabolic):    estradiol (CLIMARA - DOSED IN MG/24 HR) 0.0375 mg/24hr patch, 0.0375 mg once a week.   progesterone (PROMETRIUM) 100 MG capsule, Take 100 mg by mouth daily.   Semaglutide,0.25 or 0.5MG /DOS, (OZEMPIC, 0.25 OR 0.5 MG/DOSE,) 2 MG/1.5ML SOPN, Inject 0.5 mg into the skin once a week.  Current Outpatient Medications (Cardiovascular):    amLODipine (NORVASC) 10 MG tablet, Take 10 mg by mouth daily.   hydrochlorothiazide (HYDRODIURIL) 25 MG tablet, Take 25 mg by mouth daily.   nitroGLYCERIN (NITRO-DUR) 0.2 mg/hr patch, Place 1 patch (0.2 mg total)  onto the skin daily.  Current Outpatient Medications (Respiratory):    Loratadine 10 MG CAPS, Take 10 mg by mouth daily.  Current Outpatient Medications (Analgesics):    ibuprofen (ADVIL) 600 MG tablet, Take 1 tablet (600 mg total) by mouth every 6 (six) hours as needed.   Current Outpatient Medications (Other):    busPIRone (BUSPAR) 10 MG tablet, Take 10 mg by mouth 2 (two) times daily.   Methylcellulose, Laxative, (CITRUCEL PO), Take 2 tablets by mouth daily.    Multiple Vitamin (MULTIVITAMIN) tablet, Take 1 tablet by mouth daily.    omeprazole (PRILOSEC) 40 MG capsule, Take 40 mg by mouth daily.   Vitamin D, Ergocalciferol, (DRISDOL) 1.25 MG (50000 UNIT) CAPS capsule, Take 1 capsule (50,000 Units total) by mouth every 7 (seven) days.   Reviewed prior external information including notes and imaging from  primary care provider As well as  notes that were available from care everywhere and other healthcare systems.  Past medical history, social, surgical and family history all reviewed in electronic medical record.  No pertanent information unless stated regarding to the chief complaint.   Review of Systems:  No headache, visual changes, nausea, vomiting, diarrhea, constipation, dizziness, abdominal pain, skin rash, fevers, chills, night sweats, weight loss, swollen lymph nodes, body aches, joint swelling, chest pain, shortness of breath, mood changes. POSITIVE muscle aches  Objective  Last menstrual period 10/13/2016.   General: No apparent distress alert and oriented x3 mood and affect normal, dressed appropriately.  HEENT: Pupils equal, extraocular movements intact  Respiratory: Patient's speak in full sentences and does not appear short of breath  Cardiovascular: No lower extremity edema, non tender, no erythema  Gait normal with good balance and coordination.  MSK:  Non tender with full range of motion and good stability and symmetric strength and tone of shoulders, elbows, wrist, hip, knee and ankles bilaterally.     Impression and Recommendations:     The above documentation has been reviewed and is accurate and complete Jacqualin Combes

## 2022-01-30 ENCOUNTER — Ambulatory Visit: Payer: BC Managed Care – PPO | Admitting: Family Medicine

## 2022-02-27 NOTE — Progress Notes (Signed)
TeleHealth Visit:  This visit was completed with telemedicine (audio/video) technology. Rebecca Wood has verbally consented to this TeleHealth visit. The patient is located at home, the provider is located at home. The participants in this visit include the listed provider and patient. The visit was conducted today via MyChart video.  OBESITY Rebecca Wood is here to discuss her progress with her obesity treatment plan along with follow-up of her obesity related diagnoses.   Today's visit was # 22 Starting weight: 218 lbs Starting date: 10/20/20 Total weight loss: 77 lbs Weight last OV: 143.7 lbs Today's reported weight: 141.2 lbs   Nutrition Plan: practicing portion control and making smarter food choices, such as increasing vegetables and decreasing simple carbohydrates.  Hunger is well controlled. Cravings are well controlled.  Current exercise: none  Interim History: Rebecca Wood continues to do very well with weight maintenance.  She has actually lost 2 pounds since her next office visit.  She is not trying to lose weight but has been very busy with a new job and her side job as a Veterinary surgeon.  She feels this will even out soon and she will have more time.  She is not skipping meals and she is having protein at every meal.   Assessment/Plan:  1. Insulin Resistance Well-controlled.  Insulin low to normal. Medication(s): Ozempic 0.5 mg weekly.  Appetite very well controlled. Lab Results  Component Value Date   HGBA1C 5.5 10/23/2021   Lab Results  Component Value Date   INSULIN 2.3 (L) 10/23/2021   INSULIN 5.7 10/20/2020    Plan Refill Ozempic 0.5 mg weekly. May need to titrate down if she continues to lose weight.  Semaglutide,0.25 or 0.5MG /DOS, (OZEMPIC, 0.25 OR 0.5 MG/DOSE,) 2 MG/1.5ML SOPN    Sig: Inject 0.5 mg into the skin once a week.    Dispense:  1.5 mL    Refill:  0    2. Vitamin D Deficiency Vitamin D is at goal of 50. She is on weekly prescription Vitamin D 50,000 IU.  Lab  Results  Component Value Date   VD25OH 56.9 10/23/2021   VD25OH 34.8 03/08/2021   VD25OH 27.4 (L) 10/20/2020    Plan: Refill:  Vitamin D, Ergocalciferol, (DRISDOL) 1.25 MG (50000 UNIT) CAPS capsule    Sig: Take 1 capsule (50,000 Units total) by mouth every 7 (seven) days.    Dispense:  12 capsule    Refill:  0     3.  Bilateral shoulder pain She has been seeing Dr. Antoine Primas for bilateral shoulder issues.  Still having quite a bit of pain and reduced range of motion.  She is unable to follow-up with him currently doing to starting a new job.  She has not had time to do the recommended shoulder exercises.  Plan: Follow-up with Dr. Katrinka Blazing as soon as she can. Restart bilateral shoulder exercises at least a few times per week.  4. Obesity: Current BMI 24.98 Rebecca Wood is currently in the action stage of change. As such, her goal is to maintain weight for now.  She has agreed to practicing portion control and making smarter food choices, such as increasing vegetables and decreasing simple carbohydrates.   Exercise goals: Encouraged her to do her shoulder exercises a few times per week.  Behavioral modification strategies: increasing lean protein intake and no skipping meals.  Rebecca Wood has agreed to follow-up with our clinic in 4 weeks.   No orders of the defined types were placed in this encounter.   Medications Discontinued During This  Encounter  Medication Reason   Semaglutide,0.25 or 0.5MG /DOS, (OZEMPIC, 0.25 OR 0.5 MG/DOSE,) 2 MG/1.5ML SOPN Reorder   Vitamin D, Ergocalciferol, (DRISDOL) 1.25 MG (50000 UNIT) CAPS capsule Reorder     Meds ordered this encounter  Medications   Semaglutide,0.25 or 0.5MG /DOS, (OZEMPIC, 0.25 OR 0.5 MG/DOSE,) 2 MG/1.5ML SOPN    Sig: Inject 0.5 mg into the skin once a week.    Dispense:  1.5 mL    Refill:  0    Order Specific Question:   Supervising Provider    Answer:   Quillian Quince D [[id number]]   Vitamin D, Ergocalciferol, (DRISDOL) 1.25 MG  (50000 UNIT) CAPS capsule    Sig: Take 1 capsule (50,000 Units total) by mouth every 7 (seven) days.    Dispense:  12 capsule    Refill:  0    Order Specific Question:   Supervising Provider    Answer:   Quillian Quince D [[id number]]      Objective:   VITALS: Per patient if applicable, see vitals. GENERAL: Alert and in no acute distress. CARDIOPULMONARY: No increased WOB. Speaking in clear sentences.  PSYCH: Pleasant and cooperative. Speech normal rate and rhythm. Affect is appropriate. Insight and judgement are appropriate. Attention is focused, linear, and appropriate.  NEURO: Oriented as arrived to appointment on time with no prompting.   Lab Results  Component Value Date   CREATININE 0.69 10/23/2021   BUN 10 10/23/2021   NA 139 10/23/2021   K 4.4 10/23/2021   CL 99 10/23/2021   CO2 25 10/23/2021   Lab Results  Component Value Date   ALT 35 (H) 10/23/2021   AST 39 10/23/2021   ALKPHOS 65 10/23/2021   BILITOT 0.8 10/23/2021   Lab Results  Component Value Date   HGBA1C 5.5 10/23/2021   HGBA1C 5.3 10/20/2020   Lab Results  Component Value Date   INSULIN 2.3 (L) 10/23/2021   INSULIN 5.7 10/20/2020   Lab Results  Component Value Date   TSH 1.800 10/23/2021   Lab Results  Component Value Date   CHOL 187 10/23/2021   HDL 73 10/23/2021   LDLCALC 97 10/23/2021   TRIG 94 10/23/2021   CHOLHDL 2.6 10/23/2021   Lab Results  Component Value Date   WBC 6.1 06/08/2021   HGB 13.0 06/08/2021   HCT 39 06/08/2021   MCV 90 10/20/2020   PLT 466 (A) 06/08/2021   Lab Results  Component Value Date   IRON 93 10/23/2021   TIBC 315 10/23/2021   FERRITIN 91 10/23/2021   Lab Results  Component Value Date   VD25OH 56.9 10/23/2021   VD25OH 34.8 03/08/2021   VD25OH 27.4 (L) 10/20/2020    Attestation Statements:   Reviewed by clinician on day of visit: allergies, medications, problem list, medical history, surgical history, family history, social history, and previous  encounter notes.

## 2022-03-01 ENCOUNTER — Telehealth (INDEPENDENT_AMBULATORY_CARE_PROVIDER_SITE_OTHER): Payer: BC Managed Care – PPO | Admitting: Family Medicine

## 2022-03-01 ENCOUNTER — Encounter (INDEPENDENT_AMBULATORY_CARE_PROVIDER_SITE_OTHER): Payer: Self-pay | Admitting: Family Medicine

## 2022-03-01 VITALS — Ht 63.0 in | Wt 141.0 lb

## 2022-03-01 DIAGNOSIS — E559 Vitamin D deficiency, unspecified: Secondary | ICD-10-CM | POA: Diagnosis not present

## 2022-03-01 DIAGNOSIS — Z6824 Body mass index (BMI) 24.0-24.9, adult: Secondary | ICD-10-CM

## 2022-03-01 DIAGNOSIS — E8881 Metabolic syndrome: Secondary | ICD-10-CM

## 2022-03-01 DIAGNOSIS — M25511 Pain in right shoulder: Secondary | ICD-10-CM | POA: Diagnosis not present

## 2022-03-01 DIAGNOSIS — E669 Obesity, unspecified: Secondary | ICD-10-CM

## 2022-03-01 DIAGNOSIS — E88819 Insulin resistance, unspecified: Secondary | ICD-10-CM

## 2022-03-01 DIAGNOSIS — M25512 Pain in left shoulder: Secondary | ICD-10-CM

## 2022-03-01 MED ORDER — OZEMPIC (0.25 OR 0.5 MG/DOSE) 2 MG/1.5ML ~~LOC~~ SOPN: 0.5000 mg | PEN_INJECTOR | SUBCUTANEOUS | 0 refills | Status: DC

## 2022-03-01 MED ORDER — VITAMIN D (ERGOCALCIFEROL) 1.25 MG (50000 UNIT) PO CAPS: 50000.0000 [IU] | ORAL_CAPSULE | ORAL | 0 refills | Status: DC

## 2022-03-28 NOTE — Progress Notes (Signed)
TeleHealth Visit:  This visit was completed with telemedicine (audio/video) technology. Rebecca Wood has verbally consented to this TeleHealth visit. The patient is located at home, the provider is located at home. The participants in this visit include the listed provider and patient. The visit was conducted today via MyChart video.  OBESITY Rebecca Wood is here to discuss her progress with her obesity treatment plan along with follow-up of her obesity related diagnoses.   Today's visit was # 23 Starting weight: 218 lbs Starting date: 10/20/20 Total weight loss: 77 lbs Weight reported last OV: 141.2 lbs Today's reported weight: 141 lbs  Nutrition Plan: practicing portion control and making smarter food choices, such as increasing vegetables and decreasing simple carbohydrates.  Hunger is well controlled. Cravings are well controlled.  Current exercise: none  Interim History: Rebecca Wood is in the weight maintenance phase and has been maintaining her weight well.  Protein and water intake are good.  She eats 3 meals per day. 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  Assessment/Plan:  1. Hypertension Hypertension well controlled and stable.  Medication(s): Amlodipine 10 mg daily, HCTZ 25 mg daily.  No signs of hypotension.  BP Readings from Last 3 Encounters:  12/26/21 120/70  12/20/21 113/71  11/20/21 121/68   Lab Results  Component Value Date   CREATININE 0.69 10/23/2021   CREATININE 0.7 06/08/2021   CREATININE 0.64 10/20/2020    Plan: Current treatment plan is effective, no change in therapy..   2. Insulin Resistance Fasting insulin mildly elevated in January 2022.  Currently low to normal. Medication(s): Ozempic 0.5 mg subcu weekly.  Appetite well controlled. Lab Results  Component Value Date   HGBA1C 5.5 10/23/2021   Lab Results  Component Value Date   INSULIN 2.3 (L)  10/23/2021   INSULIN 5.7 10/20/2020    Plan Refill: Semaglutide,0.25 or 0.5MG /DOS, (OZEMPIC, 0.25 OR 0.5 MG/DOSE,) 2 MG/1.5ML SOPN   Sig: Inject 0.5 mg into the skin once a week.   Dispense:  1.5 mL   Refill:  0    3. Obesity: Current BMI 24.98 Rebecca Wood is currently in the action stage of change. As such, her goal is to continue with weight loss efforts.  She has agreed to practicing portion control and making smarter food choices, such as increasing vegetables and decreasing simple carbohydrates.   Exercise goals: When Rebecca Wood schedule allows she will begin some form of regular cardio and resistance training.  Behavioral modification strategies: increasing lean protein intake, decreasing simple carbohydrates, and planning for success.  Rebecca Wood has agreed to follow-up with our clinic in 4 weeks.   No orders of the defined types were placed in this encounter.   Medications Discontinued During This Encounter  Medication Reason   Semaglutide,0.25 or 0.5MG /DOS, (OZEMPIC, 0.25 OR 0.5 MG/DOSE,) 2 MG/1.5ML SOPN Reorder     Meds ordered this encounter  Medications   Semaglutide,0.25 or 0.5MG /DOS, (OZEMPIC, 0.25 OR 0.5 MG/DOSE,) 2 MG/1.5ML SOPN    Sig: Inject 0.5 mg into the skin once a week.    Dispense:  1.5 mL    Refill:  0    Order Specific Question:   Supervising Provider    Answer:   Quillian Quince D [[id number]]      Objective:   VITALS: Per patient if applicable, see vitals. GENERAL: Alert and in no acute distress. CARDIOPULMONARY: No increased WOB. Speaking in clear sentences.  PSYCH:  Pleasant and cooperative. Speech normal rate and rhythm. Affect is appropriate. Insight and judgement are appropriate. Attention is focused, linear, and appropriate.  NEURO: Oriented as arrived to appointment on time with no prompting.   Lab Results  Component Value Date   CREATININE 0.69 10/23/2021   BUN 10 10/23/2021   NA 139 10/23/2021   K 4.4 10/23/2021   CL 99 10/23/2021   CO2 25  10/23/2021   Lab Results  Component Value Date   ALT 35 (H) 10/23/2021   AST 39 10/23/2021   ALKPHOS 65 10/23/2021   BILITOT 0.8 10/23/2021   Lab Results  Component Value Date   HGBA1C 5.5 10/23/2021   HGBA1C 5.3 10/20/2020   Lab Results  Component Value Date   INSULIN 2.3 (L) 10/23/2021   INSULIN 5.7 10/20/2020   Lab Results  Component Value Date   TSH 1.800 10/23/2021   Lab Results  Component Value Date   CHOL 187 10/23/2021   HDL 73 10/23/2021   LDLCALC 97 10/23/2021   TRIG 94 10/23/2021   CHOLHDL 2.6 10/23/2021   Lab Results  Component Value Date   WBC 6.1 06/08/2021   HGB 13.0 06/08/2021   HCT 39 06/08/2021   MCV 90 10/20/2020   PLT 466 (A) 06/08/2021   Lab Results  Component Value Date   IRON 93 10/23/2021   TIBC 315 10/23/2021   FERRITIN 91 10/23/2021   Lab Results  Component Value Date   VD25OH 56.9 10/23/2021   VD25OH 34.8 03/08/2021   VD25OH 27.4 (L) 10/20/2020    Attestation Statements:   Reviewed by clinician on day of visit: allergies, medications, problem list, medical history, surgical history, family history, social history, and previous encounter notes.

## 2022-04-02 ENCOUNTER — Encounter (INDEPENDENT_AMBULATORY_CARE_PROVIDER_SITE_OTHER): Payer: Self-pay | Admitting: Family Medicine

## 2022-04-02 ENCOUNTER — Telehealth (INDEPENDENT_AMBULATORY_CARE_PROVIDER_SITE_OTHER): Payer: BC Managed Care – PPO | Admitting: Family Medicine

## 2022-04-02 VITALS — Ht 63.0 in | Wt 141.0 lb

## 2022-04-02 DIAGNOSIS — Z6824 Body mass index (BMI) 24.0-24.9, adult: Secondary | ICD-10-CM | POA: Diagnosis not present

## 2022-04-02 DIAGNOSIS — E88819 Insulin resistance, unspecified: Secondary | ICD-10-CM

## 2022-04-02 DIAGNOSIS — E669 Obesity, unspecified: Secondary | ICD-10-CM

## 2022-04-02 DIAGNOSIS — I10 Essential (primary) hypertension: Secondary | ICD-10-CM | POA: Diagnosis not present

## 2022-04-02 DIAGNOSIS — E8881 Metabolic syndrome: Secondary | ICD-10-CM

## 2022-04-02 DIAGNOSIS — Z7985 Long-term (current) use of injectable non-insulin antidiabetic drugs: Secondary | ICD-10-CM

## 2022-04-02 MED ORDER — OZEMPIC (0.25 OR 0.5 MG/DOSE) 2 MG/1.5ML ~~LOC~~ SOPN: 0.5000 mg | PEN_INJECTOR | SUBCUTANEOUS | 0 refills | Status: DC

## 2022-05-01 NOTE — Progress Notes (Signed)
TeleHealth Visit:  This visit was completed with telemedicine (audio/video) technology. Rebecca Wood has verbally consented to this TeleHealth visit. The patient is located at home, the provider is located at home. The participants in this visit include the listed provider and patient. The visit was conducted today via MyChart video.  OBESITY Rebecca Wood is here to discuss her progress with her obesity treatment plan along with follow-up of her obesity related diagnoses.   Today's visit was # 24 Starting weight: 218 lbs Starting date: 10/20/20 Weight reported last OV: 141 lbs Today's reported weight: 140.6 lbs Total weight loss: 78 lbs Weight change since last visit: -1 Maintenance goal: 140-145 lbs (24-25 BMI)   Nutrition Plan: practicing portion control and making smarter food choices, such as increasing vegetables and decreasing simple carbohydrates.  Hunger is well controlled. Cravings are well controlled.  Current exercise: none  Interim History: Rebecca Wood continues to do very well with weight maintenance.  She eats regular meals, has protein at every meal, and has good water intake. The area that she knows she needs to work on is adding in exercise.  She is trying to take the stairs at work instead of using the elevator. She recently tried going down to 0.25 on her Rebecca Wood dose but found that she was getting hungry so she went back up to 0.5 mg dose.  Assessment/Plan:  1. Vitamin D Deficiency Vitamin D is at goal of 50.  Last vitamin D level was 56.9 on 10/23/2021. She is on weekly prescription Vitamin D 50,000 IU.  Lab Results  Component Value Date   VD25OH 56.9 10/23/2021   VD25OH 34.8 03/08/2021   VD25OH 27.4 (L) 10/20/2020    Plan: Continue prescription vitamin D 50,000 IU weekly. Will order labs at next office visit and she will go into the office to have them drawn.  2. Insulin Resistance Her insulin resistance is well controlled.   She denies polyphagia. Medication(s):  Rebecca Wood 0.5 mg weekly.  Tried 0.25 mg but hunger increased so she went back to 0.5 mg. Lab Results  Component Value Date   HGBA1C 5.5 10/23/2021   Lab Results  Component Value Date   INSULIN 2.3 (L) 10/23/2021   INSULIN 5.7 10/20/2020    Plan Refill Rebecca Wood 0.5 mg weekly.  3. Obesity: Current BMI 24.8 Rebecca Wood is currently in the action stage of change. As such, her goal is to maintain weight for now.  She has agreed to practicing portion control and making smarter food choices, such as increasing vegetables and decreasing simple carbohydrates.   Exercise goals: Discussed importance of adding in exercise, even if it is only on the weekends.  She will work on getting in extra steps, climbing stairs at work.  Behavioral modification strategies: planning for success.  Gezelle has agreed to follow-up with our clinic in 4 weeks.   No orders of the defined types were placed in this encounter.   There are no discontinued medications.   No orders of the defined types were placed in this encounter.     Objective:   VITALS: Per patient if applicable, see vitals. GENERAL: Alert and in no acute distress. CARDIOPULMONARY: No increased WOB. Speaking in clear sentences.  PSYCH: Pleasant and cooperative. Speech normal rate and rhythm. Affect is appropriate. Insight and judgement are appropriate. Attention is focused, linear, and appropriate.  NEURO: Oriented as arrived to appointment on time with no prompting.   Lab Results  Component Value Date   CREATININE 0.69 10/23/2021   BUN 10 10/23/2021  NA 139 10/23/2021   K 4.4 10/23/2021   CL 99 10/23/2021   CO2 25 10/23/2021   Lab Results  Component Value Date   ALT 35 (H) 10/23/2021   AST 39 10/23/2021   ALKPHOS 65 10/23/2021   BILITOT 0.8 10/23/2021   Lab Results  Component Value Date   HGBA1C 5.5 10/23/2021   HGBA1C 5.3 10/20/2020   Lab Results  Component Value Date   INSULIN 2.3 (L) 10/23/2021   INSULIN 5.7 10/20/2020    Lab Results  Component Value Date   TSH 1.800 10/23/2021   Lab Results  Component Value Date   CHOL 187 10/23/2021   HDL 73 10/23/2021   LDLCALC 97 10/23/2021   TRIG 94 10/23/2021   CHOLHDL 2.6 10/23/2021   Lab Results  Component Value Date   WBC 6.1 06/08/2021   HGB 13.0 06/08/2021   HCT 39 06/08/2021   MCV 90 10/20/2020   PLT 466 (A) 06/08/2021   Lab Results  Component Value Date   IRON 93 10/23/2021   TIBC 315 10/23/2021   FERRITIN 91 10/23/2021   Lab Results  Component Value Date   VD25OH 56.9 10/23/2021   VD25OH 34.8 03/08/2021   VD25OH 27.4 (L) 10/20/2020    Attestation Statements:   Reviewed by clinician on day of visit: allergies, medications, problem list, medical history, surgical history, family history, social history, and previous encounter notes.

## 2022-05-02 ENCOUNTER — Encounter (INDEPENDENT_AMBULATORY_CARE_PROVIDER_SITE_OTHER): Payer: Self-pay | Admitting: Family Medicine

## 2022-05-02 ENCOUNTER — Telehealth (INDEPENDENT_AMBULATORY_CARE_PROVIDER_SITE_OTHER): Payer: BC Managed Care – PPO | Admitting: Family Medicine

## 2022-05-02 VITALS — Ht 63.0 in | Wt 140.0 lb

## 2022-05-02 DIAGNOSIS — E8881 Metabolic syndrome: Secondary | ICD-10-CM | POA: Diagnosis not present

## 2022-05-02 DIAGNOSIS — E559 Vitamin D deficiency, unspecified: Secondary | ICD-10-CM

## 2022-05-02 DIAGNOSIS — E88819 Insulin resistance, unspecified: Secondary | ICD-10-CM

## 2022-05-02 DIAGNOSIS — E669 Obesity, unspecified: Secondary | ICD-10-CM

## 2022-05-02 DIAGNOSIS — Z6824 Body mass index (BMI) 24.0-24.9, adult: Secondary | ICD-10-CM

## 2022-05-02 MED ORDER — OZEMPIC (0.25 OR 0.5 MG/DOSE) 2 MG/1.5ML ~~LOC~~ SOPN: 0.5000 mg | PEN_INJECTOR | SUBCUTANEOUS | 0 refills | Status: DC

## 2022-05-23 ENCOUNTER — Encounter (INDEPENDENT_AMBULATORY_CARE_PROVIDER_SITE_OTHER): Payer: Self-pay

## 2022-05-29 NOTE — Progress Notes (Signed)
TeleHealth Visit:  This visit was completed with telemedicine (audio/video) technology. [name] has verbally consented to this TeleHealth visit. The patient is located at home, the provider is located at home. The participants in this visit include the listed provider and patient. The visit was conducted today via MyChart video.  OBESITY Rebecca Wood is here to discuss her progress with her obesity treatment plan along with follow-up of her obesity related diagnoses.   Today's visit was # 25 Starting weight: 218 lbs Starting date: 10/20/20 Weight reported last OV: 140.6 lbs Today's reported weight: 140 lbs Total weight loss: 78 lbs Weight change since last visit: 0 Maintenance goal: 140-145 lbs (24-25 BMI)  Nutrition Plan: practicing portion control and making smarter food choices, such as increasing vegetables and decreasing simple carbohydrates.  Hunger is well controlled. Cravings are well controlled.  Current exercise: none  Interim History: Rebecca Wood is doing very well with weight maintenance and weight is currently at 140.  She is eating 3 meals per day, having protein at each meal, and drinking adequate water. She is taking the stairs at work (she works on the third floor) to get in extra activity.  We discussed adding in more exercise and she plans on doing this soon.  Assessment/Plan:  1. Insulin Resistance Fasting insulin is within normal range. Medication(s): Ozempic 0.5 mg subcu weekly.  Appetite well controlled. She reports that Ozempic will now require a prior authorization so future coverage is uncertain since she does not have type 2 diabetes.  She has coverage for antiobesity medications but her BMI is now 24 so coverage is unlikely. Lab Results  Component Value Date   HGBA1C 5.5 10/23/2021   Lab Results  Component Value Date   INSULIN 2.3 (L) 10/23/2021   INSULIN 5.7 10/20/2020    Plan Refill Ozempic 0.5 mg subcu weekly.  2. Hypertension Hypertension well  controlled.  Reports some symptoms of hypotension -"head rush" after bending over.  Water intake is excellent. Medication(s): HCTZ 25 mg daily, amlodipine 10 mg daily  BP Readings from Last 3 Encounters:  12/26/21 120/70  12/20/21 113/71  11/20/21 121/68   Lab Results  Component Value Date   CREATININE 0.69 10/23/2021   CREATININE 0.7 06/08/2021   CREATININE 0.64 10/20/2020    Plan: Decrease dose of amlodipine to 5 mg daily.  She will cut her 10 mg tablets in half. Continue HCTZ 25 mg daily.   3.  Generalized anxiety disorder She has been on BuSpar 10 mg twice 3 times daily for about 4 years for anxiety.  She has noted some mild cognitive issues recently.  She has only been taking the BuSpar twice daily recently and feels her cognitive issues have improved.  Anxiety is still well controlled.  Plan: Continue BuSpar 10 mg twice daily.  4. Obesity: Current BMI 24.8 [name] is currently in the action stage of change. As such, her goal is to maintain weight for now.  She has agreed to practicing portion control and making smarter food choices, such as increasing vegetables and decreasing simple carbohydrates.   Exercise goals: Urged her to add in some exercise such as walking.  She will continue to take the stairs at work and add extra steps into her workday..  Behavioral modification strategies: increasing lean protein intake, decreasing simple carbohydrates, and planning for success.  Passion has agreed to follow-up with our clinic in 4 weeks.   No orders of the defined types were placed in this encounter.   Medications Discontinued During This  Encounter  Medication Reason   nitroGLYCERIN (NITRO-DUR) 0.2 mg/hr patch Discontinued by provider   Semaglutide,0.25 or 0.5MG /DOS, (OZEMPIC, 0.25 OR 0.5 MG/DOSE,) 2 MG/1.5ML SOPN Reorder   amLODipine (NORVASC) 10 MG tablet Dose change     Meds ordered this encounter  Medications   Semaglutide,0.25 or 0.5MG /DOS, (OZEMPIC, 0.25 OR 0.5  MG/DOSE,) 2 MG/1.5ML SOPN    Sig: Inject 0.5 mg into the skin once a week.    Dispense:  1.5 mL    Refill:  0    Order Specific Question:   Supervising Provider    Answer:   Quillian Quince D [[id number]]   amLODipine (NORVASC) 5 MG tablet    Sig: Take 1 tablet (5 mg total) by mouth daily.    Dispense:  90 tablet    Order Specific Question:   Supervising Provider    Answer:   Quillian Quince D [[id number]]      Objective:   VITALS: Per patient if applicable, see vitals. GENERAL: Alert and in no acute distress. CARDIOPULMONARY: No increased WOB. Speaking in clear sentences.  PSYCH: Pleasant and cooperative. Speech normal rate and rhythm. Affect is appropriate. Insight and judgement are appropriate. Attention is focused, linear, and appropriate.  NEURO: Oriented as arrived to appointment on time with no prompting.   Lab Results  Component Value Date   CREATININE 0.69 10/23/2021   BUN 10 10/23/2021   NA 139 10/23/2021   K 4.4 10/23/2021   CL 99 10/23/2021   CO2 25 10/23/2021   Lab Results  Component Value Date   ALT 35 (H) 10/23/2021   AST 39 10/23/2021   ALKPHOS 65 10/23/2021   BILITOT 0.8 10/23/2021   Lab Results  Component Value Date   HGBA1C 5.5 10/23/2021   HGBA1C 5.3 10/20/2020   Lab Results  Component Value Date   INSULIN 2.3 (L) 10/23/2021   INSULIN 5.7 10/20/2020   Lab Results  Component Value Date   TSH 1.800 10/23/2021   Lab Results  Component Value Date   CHOL 187 10/23/2021   HDL 73 10/23/2021   LDLCALC 97 10/23/2021   TRIG 94 10/23/2021   CHOLHDL 2.6 10/23/2021   Lab Results  Component Value Date   WBC 6.1 06/08/2021   HGB 13.0 06/08/2021   HCT 39 06/08/2021   MCV 90 10/20/2020   PLT 466 (A) 06/08/2021   Lab Results  Component Value Date   IRON 93 10/23/2021   TIBC 315 10/23/2021   FERRITIN 91 10/23/2021   Lab Results  Component Value Date   VD25OH 56.9 10/23/2021   VD25OH 34.8 03/08/2021   VD25OH 27.4 (L) 10/20/2020    Attestation  Statements:   Reviewed by clinician on day of visit: allergies, medications, problem list, medical history, surgical history, family history, social history, and previous encounter notes.

## 2022-05-30 ENCOUNTER — Telehealth (INDEPENDENT_AMBULATORY_CARE_PROVIDER_SITE_OTHER): Payer: BC Managed Care – PPO | Admitting: Family Medicine

## 2022-05-30 ENCOUNTER — Encounter (INDEPENDENT_AMBULATORY_CARE_PROVIDER_SITE_OTHER): Payer: Self-pay | Admitting: Family Medicine

## 2022-05-30 VITALS — Ht 63.0 in | Wt 140.0 lb

## 2022-05-30 DIAGNOSIS — E669 Obesity, unspecified: Secondary | ICD-10-CM | POA: Diagnosis not present

## 2022-05-30 DIAGNOSIS — I10 Essential (primary) hypertension: Secondary | ICD-10-CM

## 2022-05-30 DIAGNOSIS — E88819 Insulin resistance, unspecified: Secondary | ICD-10-CM

## 2022-05-30 DIAGNOSIS — E8881 Metabolic syndrome: Secondary | ICD-10-CM

## 2022-05-30 DIAGNOSIS — Z6824 Body mass index (BMI) 24.0-24.9, adult: Secondary | ICD-10-CM

## 2022-05-30 DIAGNOSIS — F411 Generalized anxiety disorder: Secondary | ICD-10-CM

## 2022-05-30 MED ORDER — OZEMPIC (0.25 OR 0.5 MG/DOSE) 2 MG/1.5ML ~~LOC~~ SOPN: 0.5000 mg | PEN_INJECTOR | SUBCUTANEOUS | 0 refills | Status: DC

## 2022-05-30 MED ORDER — AMLODIPINE BESYLATE 5 MG PO TABS: 5.0000 mg | ORAL_TABLET | Freq: Every day | ORAL | Status: AC

## 2022-05-31 ENCOUNTER — Encounter (INDEPENDENT_AMBULATORY_CARE_PROVIDER_SITE_OTHER): Payer: Self-pay

## 2022-05-31 ENCOUNTER — Telehealth (INDEPENDENT_AMBULATORY_CARE_PROVIDER_SITE_OTHER): Payer: Self-pay | Admitting: Family Medicine

## 2022-05-31 NOTE — Telephone Encounter (Signed)
Rebecca Wood - Prior authorization denied for Ozempic. Per insurance: patient does not have type 2 diabetes. Patient sent denial message via mychart.  

## 2022-06-12 ENCOUNTER — Telehealth (INDEPENDENT_AMBULATORY_CARE_PROVIDER_SITE_OTHER): Payer: Self-pay | Admitting: Family Medicine

## 2022-06-12 ENCOUNTER — Encounter (INDEPENDENT_AMBULATORY_CARE_PROVIDER_SITE_OTHER): Payer: Self-pay

## 2022-06-12 ENCOUNTER — Encounter (INDEPENDENT_AMBULATORY_CARE_PROVIDER_SITE_OTHER): Payer: Self-pay | Admitting: Family Medicine

## 2022-06-12 DIAGNOSIS — E8881 Metabolic syndrome: Secondary | ICD-10-CM

## 2022-06-12 DIAGNOSIS — E88819 Insulin resistance, unspecified: Secondary | ICD-10-CM

## 2022-06-12 MED ORDER — WEGOVY 0.5 MG/0.5ML ~~LOC~~ SOAJ: 0.5000 mg | SUBCUTANEOUS | 0 refills | Status: DC

## 2022-06-12 NOTE — Telephone Encounter (Signed)
Dawn [employer] - Prior authorization approved for [employer]. Effective:06/12/2022 - 01/11/2023. Patient sent approval message via mychart.

## 2022-06-26 NOTE — Progress Notes (Addendum)
TeleHealth Visit:  This visit was completed with telemedicine (audio/video) technology. Rebecca Wood has verbally consented to this TeleHealth visit. The patient is located at home, the provider is located at home. The participants in this visit include the listed provider and patient. The visit was conducted today via MyChart video.  OBESITY Rebecca Wood is here to discuss her progress with her obesity treatment plan along with follow-up of her obesity related diagnoses.   Today's visit was # 26  Starting weight: 218 lbs Starting date: 10/20/20 Weight reported at last virtual office visit: 140 lbs on 05/30/22 Today's reported weight: 140.8 lbs  Total weight loss: 78 Weight change since last visit: 0 Maintenance goal: 140-145 lbs (24-25 BMI)  Nutrition Plan: practicing portion control and making smarter food choices, such as increasing vegetables and decreasing simple carbohydrates.   Current exercise: She is taking the stairs at work (she works on the third floor) to get in extra activity. Sometimes as much as 8100 steps/day at work.  Interim History: Rebecca Wood is doing very well with weight maintenance and weight is currently at 140.  She is eating 3 meals per day, having protein at each meal, and drinking adequate water. She was unable to obtain Wegovy 0.5 mg because of the nationwide shortage.  Last Ozempic dose was 10 days ago.  Assessment/Plan:  1. Insulin Resistance Fasting insulin is now normal.  Last A1c was 5.5 on 10/23/2021.  Unable to obtain Ozempic because she does had not have type 2 diabetes.  Last dose of Ozempic was 10 days ago.  We switched her to [facility] 0.5 mg but she was unable to obtain this due to the nationwide drug shortage.  Lab Results  Component Value Date   HGBA1C 5.5 10/23/2021   Lab Results  Component Value Date   INSULIN 2.3 (L) 10/23/2021   INSULIN 5.7 10/20/2020    Plan Continue to work on getting plenty of protein and limiting simple carbohydrates. If she  finds she is getting too hungry we will start Wegovy at 1.7 mg since this dose is generally available and possibly have her do injections every other week.  Discussed that she would likely have nausea with the first injection since she will have been off of GLP-1 therapy for a while.  2. Hypertension Hypertension well controlled.  Medication(s): Amlodipine 5 mg daily and HCTZ 25 mg daily  BP Readings from Last 3 Encounters:  12/26/21 120/70  12/20/21 113/71  11/20/21 121/68   Lab Results  Component Value Date   CREATININE 0.69 10/23/2021   CREATININE 0.7 06/08/2021   CREATININE 0.64 10/20/2020    Plan: Continue amlodipine 5 mg daily and HCTZ 25 mg daily.  We will continue to monitor for hypotension.   3. Obesity: Current BMI 24.8 Rebecca Wood is currently in the action stage of change. As such, her goal is to maintain weight for now.  She has agreed to practicing portion control and making smarter food choices, such as increasing vegetables and decreasing simple carbohydrates.   Exercise goals: She plans to begin exercising before her next office visit.  Behavioral modification strategies: increasing lean protein intake and decreasing simple carbohydrates.  Rebecca Wood has agreed to follow-up with our clinic in 5 weeks.    Objective:   VITALS: Per patient if applicable, see vitals. GENERAL: Alert and in no acute distress. CARDIOPULMONARY: No increased WOB. Speaking in clear sentences.  PSYCH: Pleasant and cooperative. Speech normal rate and rhythm. Affect is appropriate. Insight and judgement are appropriate. Attention is focused,  linear, and appropriate.  NEURO: Oriented as arrived to appointment on time with no prompting.   Lab Results  Component Value Date   CREATININE 0.69 10/23/2021   BUN 10 10/23/2021   NA 139 10/23/2021   K 4.4 10/23/2021   CL 99 10/23/2021   CO2 25 10/23/2021   Lab Results  Component Value Date   ALT 35 (H) 10/23/2021   AST 39 10/23/2021   ALKPHOS 65  10/23/2021   BILITOT 0.8 10/23/2021   Lab Results  Component Value Date   HGBA1C 5.5 10/23/2021   HGBA1C 5.3 10/20/2020   Lab Results  Component Value Date   INSULIN 2.3 (L) 10/23/2021   INSULIN 5.7 10/20/2020   Lab Results  Component Value Date   TSH 1.800 10/23/2021   Lab Results  Component Value Date   CHOL 187 10/23/2021   HDL 73 10/23/2021   LDLCALC 97 10/23/2021   TRIG 94 10/23/2021   CHOLHDL 2.6 10/23/2021   Lab Results  Component Value Date   WBC 6.1 06/08/2021   HGB 13.0 06/08/2021   HCT 39 06/08/2021   MCV 90 10/20/2020   PLT 466 (A) 06/08/2021   Lab Results  Component Value Date   IRON 93 10/23/2021   TIBC 315 10/23/2021   FERRITIN 91 10/23/2021   Lab Results  Component Value Date   VD25OH 56.9 10/23/2021   VD25OH 34.8 03/08/2021   VD25OH 27.4 (L) 10/20/2020    Attestation Statements:   Reviewed by clinician on day of visit: allergies, medications, problem list, medical history, surgical history, family history, social history, and previous encounter notes.  Time spent on visit including the items listed below was 30 minutes.  -preparing to see the patient (e.g., review of tests, history, previous notes) -obtaining and/or reviewing separately obtained history -counseling and educating the patient/family/caregiver -documenting clinical information in the electronic or other health record

## 2022-06-27 ENCOUNTER — Encounter (INDEPENDENT_AMBULATORY_CARE_PROVIDER_SITE_OTHER): Payer: Self-pay | Admitting: Family Medicine

## 2022-06-27 ENCOUNTER — Telehealth (INDEPENDENT_AMBULATORY_CARE_PROVIDER_SITE_OTHER): Payer: BC Managed Care – PPO | Admitting: Family Medicine

## 2022-06-27 VITALS — Ht 63.0 in | Wt 140.0 lb

## 2022-06-27 DIAGNOSIS — E669 Obesity, unspecified: Secondary | ICD-10-CM | POA: Diagnosis not present

## 2022-06-27 DIAGNOSIS — Z6824 Body mass index (BMI) 24.0-24.9, adult: Secondary | ICD-10-CM

## 2022-06-27 DIAGNOSIS — Z6838 Body mass index (BMI) 38.0-38.9, adult: Secondary | ICD-10-CM

## 2022-06-27 DIAGNOSIS — E8881 Metabolic syndrome: Secondary | ICD-10-CM | POA: Diagnosis not present

## 2022-06-27 DIAGNOSIS — I10 Essential (primary) hypertension: Secondary | ICD-10-CM

## 2022-06-27 DIAGNOSIS — E88819 Insulin resistance, unspecified: Secondary | ICD-10-CM

## 2022-07-31 NOTE — Progress Notes (Unsigned)
TeleHealth Visit:  This visit was completed with telemedicine (audio/video) technology. [name] has verbally consented to this TeleHealth visit. The patient is located at home, the provider is located at home. The participants in this visit include the listed provider and patient. The visit was conducted today via MyChart video.  OBESITY Rebecca Wood is here to discuss her progress with her obesity treatment plan along with follow-up of her obesity related diagnoses.    Today's visit was # 27  Starting weight: 218 lbs Starting date: 10/20/20 Weight reported at last virtual office visit: 140.8 lbs on 06/27/22 Today's reported weight: 147 lbs  Total weight loss: 71 Weight change since last visit: +7  Maintenance goal: 140-145 lbs (24-25 BMI)  Nutrition Plan: practicing portion control and making smarter food choices, such as increasing vegetables and decreasing simple carbohydrates.   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 Sometimes as much as 8100 steps/day at work.  Interim History: Rebecca Wood is struggling with increased hunger and cravings since being off of Ozempic.  Her last dose was 06/17/2022.  She is snacking more and has larger portion sizes. She is eating 3 meals per day, having protein at each meal, and drinking adequate water.  She would like to go back on a GLP-1.  Prescribed Wegovy 0.5 mg last office visit but she has been unable to find this dose due to the national drug shortage.  Assessment/Plan:  1. Insulin Resistance Rebecca Wood endorses excessive hunger.  Medication(s): none  Plan: I have sent the [facility] 0.5 prescription to med [facility] to see if they have this available.  If not she will let me know and we will start Saxenda.  Titration instructions provided.   2. Vitamin D Deficiency Vitamin D is at goal of 50.  Status post gastric bypass in 2006. She is on weekly prescription Vitamin D 50,000 IU.   Lab Results  Component Value Date   VD25OH 56.9 10/23/2021   VD25OH 34.8 03/08/2021   VD25OH 27.4 (L) 10/20/2020    Plan: Continue prescription vitamin D 50,000 IU weekly.   3. Obesity: Current BMI 26.05 Rebecca Wood is currently in the action stage of change. As such, her goal is to continue with weight loss efforts.  She has agreed to practicing portion control and making smarter food choices, such as increasing vegetables and decreasing simple carbohydrates.   Exercise goals: as is  Behavioral modification strategies: decreasing simple carbohydrates, avoiding temptations, and planning for success.  Rebecca Wood has agreed to follow-up with our clinic in 4 weeks.   No orders of the defined types were placed in this encounter.   Medications Discontinued During This Encounter  Medication Reason   Semaglutide-Weight Management (WEGOVY) 0.5 MG/0.5ML SOAJ Reorder     Meds ordered this encounter  Medications   Semaglutide-Weight Management (WEGOVY) 0.5 MG/0.5ML SOAJ    Sig: Inject 0.5 mg into the skin once a week.    Dispense:  2 mL    Refill:  0    Order Specific Question:   Supervising Provider    Answer:   Glennis Brink [[id number]]      Objective:   VITALS: Per patient if applicable, see vitals. GENERAL: Alert and in no acute distress. CARDIOPULMONARY: No increased WOB. Speaking in clear sentences.  PSYCH: Pleasant and cooperative. Speech normal rate and rhythm. Affect is appropriate. Insight and judgement are appropriate. Attention is focused, linear, and appropriate.  NEURO: Oriented as arrived to appointment  on time with no prompting.   Lab Results  Component Value Date   CREATININE 0.69 10/23/2021   BUN 10 10/23/2021   NA 139 10/23/2021   K 4.4 10/23/2021   CL 99 10/23/2021   CO2 25 10/23/2021   Lab Results  Component Value Date   ALT 35 (H) 10/23/2021   AST 39 10/23/2021   ALKPHOS 65 10/23/2021   BILITOT 0.8 10/23/2021   Lab Results  Component Value Date   HGBA1C  5.5 10/23/2021   HGBA1C 5.3 10/20/2020   Lab Results  Component Value Date   INSULIN 2.3 (L) 10/23/2021   INSULIN 5.7 10/20/2020   Lab Results  Component Value Date   TSH 1.800 10/23/2021   Lab Results  Component Value Date   CHOL 187 10/23/2021   HDL 73 10/23/2021   LDLCALC 97 10/23/2021   TRIG 94 10/23/2021   CHOLHDL 2.6 10/23/2021   Lab Results  Component Value Date   WBC 6.1 06/08/2021   HGB 13.0 06/08/2021   HCT 39 06/08/2021   MCV 90 10/20/2020   PLT 466 (A) 06/08/2021   Lab Results  Component Value Date   IRON 93 10/23/2021   TIBC 315 10/23/2021   FERRITIN 91 10/23/2021   Lab Results  Component Value Date   VD25OH 56.9 10/23/2021   VD25OH 34.8 03/08/2021   VD25OH 27.4 (L) 10/20/2020    Attestation Statements:   Reviewed by clinician on day of visit: allergies, medications, problem list, medical history, surgical history, family history, social history, and previous encounter notes.

## 2022-08-01 ENCOUNTER — Telehealth (INDEPENDENT_AMBULATORY_CARE_PROVIDER_SITE_OTHER): Payer: BC Managed Care – PPO | Admitting: Family Medicine

## 2022-08-01 ENCOUNTER — Other Ambulatory Visit (HOSPITAL_BASED_OUTPATIENT_CLINIC_OR_DEPARTMENT_OTHER): Payer: Self-pay

## 2022-08-01 ENCOUNTER — Encounter (INDEPENDENT_AMBULATORY_CARE_PROVIDER_SITE_OTHER): Payer: Self-pay | Admitting: Family Medicine

## 2022-08-01 ENCOUNTER — Encounter (HOSPITAL_BASED_OUTPATIENT_CLINIC_OR_DEPARTMENT_OTHER): Payer: Self-pay | Admitting: Pharmacist

## 2022-08-01 VITALS — Ht 63.0 in | Wt 147.0 lb

## 2022-08-01 DIAGNOSIS — E559 Vitamin D deficiency, unspecified: Secondary | ICD-10-CM

## 2022-08-01 DIAGNOSIS — Z6826 Body mass index (BMI) 26.0-26.9, adult: Secondary | ICD-10-CM

## 2022-08-01 DIAGNOSIS — E88819 Insulin resistance, unspecified: Secondary | ICD-10-CM

## 2022-08-01 DIAGNOSIS — E669 Obesity, unspecified: Secondary | ICD-10-CM

## 2022-08-01 MED ORDER — WEGOVY 0.5 MG/0.5ML ~~LOC~~ SOAJ
0.5000 mg | SUBCUTANEOUS | 0 refills | Status: DC
  Filled 2022-08-01: qty 2, 28d supply, fill #0

## 2022-08-02 ENCOUNTER — Other Ambulatory Visit (HOSPITAL_BASED_OUTPATIENT_CLINIC_OR_DEPARTMENT_OTHER): Payer: Self-pay

## 2022-08-22 ENCOUNTER — Other Ambulatory Visit (HOSPITAL_BASED_OUTPATIENT_CLINIC_OR_DEPARTMENT_OTHER): Payer: Self-pay

## 2022-08-28 NOTE — Progress Notes (Unsigned)
TeleHealth Visit:  This visit was completed with telemedicine (audio/video) technology. [name] has verbally consented to this TeleHealth visit. The patient is located at home, the provider is located at home. The participants in this visit include the listed provider and patient. The visit was conducted today via MyChart video.  OBESITY Rebecca Wood is here to discuss her progress with her obesity treatment plan along with follow-up of her obesity related diagnoses.    Today's visit was # 28  Starting weight: 218 lbs Starting date: 10/20/20 Weight reported at last virtual office visit: 147 lbs on 08/01/22 Today's reported weight: 149.2 lbs No weight reported. Total weight loss: 69 lbs Weight change since last visit: +2  Maintenance goal: 140-145 lbs (24-25 BMI)   Nutrition Plan: practicing portion control and making smarter food choices, such as increasing vegetables and decreasing simple carbohydrates.   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 Sometimes as much as 8100 steps/day at work.  Interim History: [name] was unable to get the [facility] prescribed at last visit due to availability issues.  She continues to struggle with excessive hunger.  She does eat 3 healthy meals per day and gets plenty of protein but reports larger portions and snacking more since being off of the Ozempic.  Her insurance stopped covering Ozempic in August.  She has coverage for antiobesity medications.  Assessment/Plan:  1. Polyphagia Onell endorses excessive hunger since coming off of Ozempic in August.  She has been unable to get Wegovy 0.5 mg. Medication(s): none  Plan: She will call [facility] pharmacy who has had some of the low doses of Wegovy If unavailable she will let me know and we will start Saxenda. Titration instructions provided. She will start 0.6 mg of Saxenda for 3 to 4 days, if no nausea or other side effects she will increase to 1.2  mg for 3 to 4 days, if no side effects increase to 1.8 mg.  2. Obesity: Current BMI 26.4 [name] is currently in the action stage of change. As such, her goal is to continue with weight loss efforts.  She has agreed to practicing portion control and making smarter food choices, such as increasing vegetables and decreasing simple carbohydrates.   Exercise goals: as is  Behavioral modification strategies: decreasing simple carbohydrates and planning for success.  [name] has agreed to follow-up with our clinic in 4 weeks.   No orders of the defined types were placed in this encounter.   There are no discontinued medications.   No orders of the defined types were placed in this encounter.     Objective:   VITALS: Per patient if applicable, see vitals. GENERAL: Alert and in no acute distress. CARDIOPULMONARY: No increased WOB. Speaking in clear sentences.  PSYCH: Pleasant and cooperative. Speech normal rate and rhythm. Affect is appropriate. Insight and judgement are appropriate. Attention is focused, linear, and appropriate.  NEURO: Oriented as arrived to appointment on time with no prompting.   Lab Results  Component Value Date   CREATININE 0.69 10/23/2021   BUN 10 10/23/2021   NA 139 10/23/2021   K 4.4 10/23/2021   CL 99 10/23/2021   CO2 25 10/23/2021   Lab Results  Component Value Date   ALT 35 (H) 10/23/2021   AST 39 10/23/2021   ALKPHOS 65 10/23/2021   BILITOT 0.8 10/23/2021   Lab Results  Component Value Date   HGBA1C 5.5 10/23/2021   HGBA1C 5.3 10/20/2020  Lab Results  Component Value Date   INSULIN 2.3 (L) 10/23/2021   INSULIN 5.7 10/20/2020   Lab Results  Component Value Date   TSH 1.800 10/23/2021   Lab Results  Component Value Date   CHOL 187 10/23/2021   HDL 73 10/23/2021   LDLCALC 97 10/23/2021   TRIG 94 10/23/2021   CHOLHDL 2.6 10/23/2021   Lab Results  Component Value Date   WBC 6.1 06/08/2021   HGB 13.0 06/08/2021   HCT 39 06/08/2021    MCV 90 10/20/2020   PLT 466 (A) 06/08/2021   Lab Results  Component Value Date   IRON 93 10/23/2021   TIBC 315 10/23/2021   FERRITIN 91 10/23/2021   Lab Results  Component Value Date   VD25OH 56.9 10/23/2021   VD25OH 34.8 03/08/2021   VD25OH 27.4 (L) 10/20/2020    Attestation Statements:   Reviewed by clinician on day of visit: allergies, medications, problem list, medical history, surgical history, family history, social history, and previous encounter notes.

## 2022-08-29 ENCOUNTER — Other Ambulatory Visit (INDEPENDENT_AMBULATORY_CARE_PROVIDER_SITE_OTHER): Payer: Self-pay | Admitting: Family Medicine

## 2022-08-29 ENCOUNTER — Other Ambulatory Visit (HOSPITAL_BASED_OUTPATIENT_CLINIC_OR_DEPARTMENT_OTHER): Payer: Self-pay

## 2022-08-29 ENCOUNTER — Encounter (INDEPENDENT_AMBULATORY_CARE_PROVIDER_SITE_OTHER): Payer: Self-pay | Admitting: Family Medicine

## 2022-08-29 ENCOUNTER — Other Ambulatory Visit (HOSPITAL_COMMUNITY): Payer: Self-pay

## 2022-08-29 ENCOUNTER — Telehealth (INDEPENDENT_AMBULATORY_CARE_PROVIDER_SITE_OTHER): Payer: BC Managed Care – PPO | Admitting: Family Medicine

## 2022-08-29 VITALS — Ht 63.0 in | Wt 149.0 lb

## 2022-08-29 DIAGNOSIS — E669 Obesity, unspecified: Secondary | ICD-10-CM

## 2022-08-29 DIAGNOSIS — R632 Polyphagia: Secondary | ICD-10-CM

## 2022-08-29 DIAGNOSIS — Z6826 Body mass index (BMI) 26.0-26.9, adult: Secondary | ICD-10-CM

## 2022-08-29 MED ORDER — BD PEN NEEDLE NANO 2ND GEN 32G X 4 MM MISC
0 refills | Status: DC
  Filled 2022-08-29 – 2022-09-12 (×2): qty 100, 100d supply, fill #0

## 2022-08-29 MED ORDER — SAXENDA 18 MG/3ML ~~LOC~~ SOPN
3.0000 mg | PEN_INJECTOR | Freq: Every day | SUBCUTANEOUS | 0 refills | Status: DC
  Filled 2022-08-29 – 2022-09-18 (×4): qty 15, 30d supply, fill #0

## 2022-08-30 ENCOUNTER — Other Ambulatory Visit (HOSPITAL_COMMUNITY): Payer: Self-pay

## 2022-09-07 ENCOUNTER — Other Ambulatory Visit (HOSPITAL_COMMUNITY): Payer: Self-pay

## 2022-09-13 ENCOUNTER — Encounter (INDEPENDENT_AMBULATORY_CARE_PROVIDER_SITE_OTHER): Payer: Self-pay | Admitting: Family Medicine

## 2022-09-13 ENCOUNTER — Other Ambulatory Visit (HOSPITAL_COMMUNITY): Payer: Self-pay

## 2022-09-18 ENCOUNTER — Other Ambulatory Visit (HOSPITAL_COMMUNITY): Payer: Self-pay

## 2022-09-20 ENCOUNTER — Telehealth: Payer: Self-pay

## 2022-09-20 NOTE — Telephone Encounter (Signed)
Prior Berkley Harvey has been submitted and is currently pending results. "Pt will be notified once decision is made.

## 2022-09-21 ENCOUNTER — Other Ambulatory Visit (HOSPITAL_COMMUNITY): Payer: Self-pay

## 2022-09-25 NOTE — Progress Notes (Signed)
TeleHealth Visit:  This visit was completed with telemedicine (audio/video) technology. Rebecca Wood has verbally consented to this TeleHealth visit. The patient is located at home, the provider is located at home. The participants in this visit include the listed provider and patient. The visit was conducted today via MyChart video.  OBESITY Rebecca Wood is here to discuss her progress with her obesity treatment plan along with follow-up of her obesity related diagnoses.    Today's visit was # 29  Starting weight: 218 lbs Starting date: 10/20/20 Weight reported at last virtual office visit: 149.2 lbs on 08/29/22 Today's reported weight: 152.9 lbs  Total weight loss: 66 Weight change since last visit: +3   Maintenance goal: 140-145 lbs (24-25 BMI)   Nutrition Plan: practicing portion control and making smarter food choices, such as increasing vegetables and decreasing simple carbohydrates.    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 Sometimes as much as 8100 steps/day at work.  Interim History: Coverage was denied for Saxenda.  Since she has been off of Ozempic (last dose in August) she has had increased polyphagia and cravings. We have prior authorization for [facility] but she has been unable to obtain the 0.5 mg dose. She is struggling with making poor food choices even at meals.  Eats vegetables a few times per week.    Protein and water intake is good.  She eats regular meals.  She finds that she is bringing unhealthy snacks to work (potato chips). Assessment/Plan:  1. Insulin Resistance Last fasting insulin was normal at 2.3.  Has been off of GLP-1 therapy since August.  She will have labs drawn at PCP this month.  Lab Results  Component Value Date   HGBA1C 5.5 10/23/2021   Lab Results  Component Value Date   INSULIN 2.3 (L) 10/23/2021   INSULIN 5.7 10/20/2020    Plan New prescription for metformin 500 mg daily with  breakfast. She will ask PCP if she will check her fasting insulin.   2.  Polyphagia Rebecca Wood endorses excessive hunger and cravings.  State health insurance plan will stop covering GLP therapy in January for those who are not already on GLP-1 therapy. Medication(s): none  Plan: Prescription sent for Wegovy 0.5 mg weekly in hopes that she will be able to obtain this in the new year.   3. Obesity: Current BMI 26.93 Rebecca Wood is currently in the action stage of change. As such, her goal is to continue with weight loss efforts.  She has agreed to the Category 3 Plan.   Limit snack calories to 300/day. Grocery shop on a full stomach.  Exercise goals:  as is  Behavioral modification strategies: increasing lean protein intake, decreasing simple carbohydrates, keeping healthy foods in the home, better snacking choices, and planning for success.  Rebecca Wood has agreed to follow-up with our clinic in 4 weeks.   No orders of the defined types were placed in this encounter.   Medications Discontinued During This Encounter  Medication Reason   Liraglutide -Weight Management (SAXENDA) 18 MG/3ML SOPN Change in therapy   Insulin Pen Needle (BD PEN NEEDLE NANO 2ND GEN) 32G X 4 MM MISC Discontinued by provider   Semaglutide-Weight Management (WEGOVY) 0.25 MG/0.5ML SOAJ Dose change     Meds ordered this encounter  Medications   DISCONTD: Semaglutide-Weight Management (WEGOVY) 0.25 MG/0.5ML SOAJ    Sig: Inject 0.25 mg into the skin once a week.    Dispense:  2  mL    Refill:  0    Order Specific Question:   Supervising Provider    Answer:   Carolin Sicks   metFORMIN (GLUCOPHAGE) 500 MG tablet    Sig: Take 1 tablet (500 mg total) by mouth daily with breakfast.    Dispense:  30 tablet    Refill:  0    Order Specific Question:   Supervising Provider    Answer:   Carolin Sicks   Semaglutide-Weight Management (WEGOVY) 0.5 MG/0.5ML SOAJ    Sig: Inject 0.5 mg into the skin once a week.     Dispense:  2 mL    Refill:  0    Order Specific Question:   Supervising Provider    Answer:   Glennis Brink [[id number]]      Objective:   VITALS: Per patient if applicable, see vitals. GENERAL: Alert and in no acute distress. CARDIOPULMONARY: No increased WOB. Speaking in clear sentences.  PSYCH: Pleasant and cooperative. Speech normal rate and rhythm. Affect is appropriate. Insight and judgement are appropriate. Attention is focused, linear, and appropriate.  NEURO: Oriented as arrived to appointment on time with no prompting.   Lab Results  Component Value Date   CREATININE 0.69 10/23/2021   BUN 10 10/23/2021   NA 139 10/23/2021   K 4.4 10/23/2021   CL 99 10/23/2021   CO2 25 10/23/2021   Lab Results  Component Value Date   ALT 35 (H) 10/23/2021   AST 39 10/23/2021   ALKPHOS 65 10/23/2021   BILITOT 0.8 10/23/2021   Lab Results  Component Value Date   HGBA1C 5.5 10/23/2021   HGBA1C 5.3 10/20/2020   Lab Results  Component Value Date   INSULIN 2.3 (L) 10/23/2021   INSULIN 5.7 10/20/2020   Lab Results  Component Value Date   TSH 1.800 10/23/2021   Lab Results  Component Value Date   CHOL 187 10/23/2021   HDL 73 10/23/2021   LDLCALC 97 10/23/2021   TRIG 94 10/23/2021   CHOLHDL 2.6 10/23/2021   Lab Results  Component Value Date   WBC 6.1 06/08/2021   HGB 13.0 06/08/2021   HCT 39 06/08/2021   MCV 90 10/20/2020   PLT 466 (A) 06/08/2021   Lab Results  Component Value Date   IRON 93 10/23/2021   TIBC 315 10/23/2021   FERRITIN 91 10/23/2021   Lab Results  Component Value Date   VD25OH 56.9 10/23/2021   VD25OH 34.8 03/08/2021   VD25OH 27.4 (L) 10/20/2020    Attestation Statements:   Reviewed by clinician on day of visit: allergies, medications, problem list, medical history, surgical history, family history, social history, and previous encounter notes.

## 2022-09-26 ENCOUNTER — Telehealth (INDEPENDENT_AMBULATORY_CARE_PROVIDER_SITE_OTHER): Payer: BC Managed Care – PPO | Admitting: Family Medicine

## 2022-09-26 ENCOUNTER — Encounter (INDEPENDENT_AMBULATORY_CARE_PROVIDER_SITE_OTHER): Payer: Self-pay | Admitting: Family Medicine

## 2022-09-26 VITALS — Ht 63.0 in | Wt 152.0 lb

## 2022-09-26 DIAGNOSIS — E88819 Insulin resistance, unspecified: Secondary | ICD-10-CM | POA: Diagnosis not present

## 2022-09-26 DIAGNOSIS — E669 Obesity, unspecified: Secondary | ICD-10-CM | POA: Diagnosis not present

## 2022-09-26 DIAGNOSIS — Z6826 Body mass index (BMI) 26.0-26.9, adult: Secondary | ICD-10-CM | POA: Diagnosis not present

## 2022-09-26 DIAGNOSIS — R632 Polyphagia: Secondary | ICD-10-CM | POA: Diagnosis not present

## 2022-09-26 MED ORDER — WEGOVY 0.5 MG/0.5ML ~~LOC~~ SOAJ: 0.5000 mg | SUBCUTANEOUS | 0 refills | Status: DC

## 2022-09-26 MED ORDER — METFORMIN HCL 500 MG PO TABS: 500.0000 mg | ORAL_TABLET | Freq: Every day | ORAL | 0 refills | Status: DC

## 2022-09-26 MED ORDER — WEGOVY 0.25 MG/0.5ML ~~LOC~~ SOAJ: 0.2500 mg | SUBCUTANEOUS | 0 refills | Status: DC

## 2022-09-27 ENCOUNTER — Other Ambulatory Visit (INDEPENDENT_AMBULATORY_CARE_PROVIDER_SITE_OTHER): Payer: Self-pay | Admitting: Family Medicine

## 2022-09-27 LAB — TSH: TSH: 1.48 (ref 0.41–5.90)

## 2022-09-27 LAB — LIPID PANEL
Cholesterol: 185 (ref 0–200)
HDL: 84 — AB (ref 35–70)
LDL Cholesterol: 83
LDl/HDL Ratio: 2.2
Triglycerides: 103 (ref 40–160)

## 2022-09-27 LAB — VITAMIN D 25 HYDROXY (VIT D DEFICIENCY, FRACTURES): Vit D, 25-Hydroxy: 34.9

## 2022-09-27 LAB — BASIC METABOLIC PANEL
BUN: 12 (ref 4–21)
CO2: 9 — AB (ref 13–22)
Chloride: 101 (ref 99–108)
Creatinine: 0.7 (ref 0.5–1.1)
Glucose: 88
Potassium: 4 mEq/L (ref 3.5–5.1)
Sodium: 137 (ref 137–147)

## 2022-09-27 LAB — HEPATIC FUNCTION PANEL
ALT: 30 U/L (ref 7–35)
AST: 28 (ref 13–35)

## 2022-09-27 LAB — CBC AND DIFFERENTIAL
HCT: 38 (ref 36–46)
Hemoglobin: 12.6 (ref 12.0–16.0)
Platelets: 441 10*3/uL — AB (ref 150–400)
WBC: 6.8

## 2022-09-27 LAB — VITAMIN B12: Vitamin B-12: 805

## 2022-09-27 LAB — CBC: RBC: 4.33 (ref 3.87–5.11)

## 2022-09-27 LAB — COMPREHENSIVE METABOLIC PANEL
Albumin: 4.3 (ref 3.5–5.0)
eGFR: 100

## 2022-09-27 MED ORDER — WEGOVY 0.5 MG/0.5ML ~~LOC~~ SOAJ: 0.5000 mg | SUBCUTANEOUS | 0 refills | Status: DC

## 2022-09-28 ENCOUNTER — Other Ambulatory Visit: Payer: Self-pay | Admitting: Family Medicine

## 2022-09-28 ENCOUNTER — Ambulatory Visit
Admission: RE | Admit: 2022-09-28 | Discharge: 2022-09-28 | Disposition: A | Discharge status: Home / Self Care | Payer: BC Managed Care – PPO | Source: Ambulatory Visit | Attending: Family Medicine | Admitting: Family Medicine

## 2022-09-28 DIAGNOSIS — M5010 Cervical disc disorder with radiculopathy, unspecified cervical region: Secondary | ICD-10-CM

## 2022-10-02 ENCOUNTER — Other Ambulatory Visit: Payer: Self-pay

## 2022-10-03 ENCOUNTER — Ambulatory Visit (INDEPENDENT_AMBULATORY_CARE_PROVIDER_SITE_OTHER): Payer: BC Managed Care – PPO | Admitting: Family Medicine

## 2022-10-03 ENCOUNTER — Ambulatory Visit (INDEPENDENT_AMBULATORY_CARE_PROVIDER_SITE_OTHER): Payer: BC Managed Care – PPO

## 2022-10-03 ENCOUNTER — Ambulatory Visit: Payer: Self-pay

## 2022-10-03 VITALS — BP 120/60 | HR 77 | Ht 63.0 in | Wt 165.0 lb

## 2022-10-03 DIAGNOSIS — M542 Cervicalgia: Secondary | ICD-10-CM

## 2022-10-03 DIAGNOSIS — M25512 Pain in left shoulder: Secondary | ICD-10-CM

## 2022-10-03 DIAGNOSIS — M79602 Pain in left arm: Secondary | ICD-10-CM

## 2022-10-03 DIAGNOSIS — M75112 Incomplete rotator cuff tear or rupture of left shoulder, not specified as traumatic: Secondary | ICD-10-CM

## 2022-10-03 DIAGNOSIS — M5010 Cervical disc disorder with radiculopathy, unspecified cervical region: Secondary | ICD-10-CM

## 2022-10-03 DIAGNOSIS — M19019 Primary osteoarthritis, unspecified shoulder: Secondary | ICD-10-CM

## 2022-10-03 MED ORDER — KETOROLAC TROMETHAMINE 30 MG/ML IJ SOLN
30.0000 mg | Freq: Once | INTRAMUSCULAR | Status: AC
  Administered 2022-10-03: 30 mg via INTRAMUSCULAR

## 2022-10-03 MED ORDER — GABAPENTIN 100 MG PO CAPS: 100.0000 mg | ORAL_CAPSULE | Freq: Two times a day (BID) | ORAL | 3 refills | Status: DC

## 2022-10-03 MED ORDER — MELOXICAM 7.5 MG PO TABS: 7.5000 mg | ORAL_TABLET | Freq: Every day | ORAL | 0 refills | Status: DC

## 2022-10-03 MED ORDER — METHYLPREDNISOLONE ACETATE 40 MG/ML IJ SUSP
40.0000 mg | Freq: Once | INTRAMUSCULAR | Status: AC
  Administered 2022-10-03: 40 mg via INTRAMUSCULAR

## 2022-10-03 NOTE — Assessment & Plan Note (Signed)
Patient does have what appears to be more cervical radiculopathy.  Discussed with patient icing regimen.  We discussed gabapentin, meloxicam low doses.  We discussed Toradol and Depo-Medrol given today as well.  Increase activity slowly otherwise.  Follow-up again 5 to 6 weeks after formal physical therapy as well.  If worsening symptoms or weakness do need to consider the possibility of advanced imaging and epidurals.

## 2022-10-03 NOTE — Assessment & Plan Note (Signed)
Patient does have a rotator cuff tear as well as severe AC arthropathy.  We discussed with patient about different treatment options.  Concern at the moment because patient is having more cervical radiculopathy with a positive Spurling's and weakness of the arm.  Patient will start with home exercises and formal physical therapy.  Did not feel that nitroglycerin was helpful and had side effects.  We discussed that if no significant improvement advanced imaging would need to be warranted especially with this starting back in March and patient being lost to follow-up.  Follow-up with me though again in 6 weeks

## 2022-10-03 NOTE — Patient Instructions (Signed)
Good to see you  Injection in backside today  Gabapentin 200mg  nightly [facility] at [facility] Meloxicam 7.5mg  daily as needed starting tomorrow  Left shoulder x ray on the way out.  Follow up in 6-8 weeks

## 2022-10-03 NOTE — Progress Notes (Signed)
[facility] [address] [place] [address] Phone: [phone] Subjective:   Rebecca Wood, am serving as a scribe for Dr. Antoine Primas.  I'm seeing this patient by the request  of:  Shirlean Mylar, MD  CC: shoulder pain  [id number]  Rebecca Wood is a 56 y.o. female coming in with complaint of shoulder.  Was found to have a left rotator cuff tear as well as acromioclavicular arthritis.  Was given nitroglycerin patches. Patient states that she will get some numbness and tingling in the left arm and and hand. This will wake her up at night and has recently started doing that during the day and the arm feels very heavy. Patient locates pain to her bicep. Patient started to notice let wrist pain recently as well.    Neck exam does have mild to moderate degenerative disc disease at multiple levels mostly at the C5-C6.    Past Medical History:  Diagnosis Date   Anxiety    B12 deficiency    Back pain    Chest pain    Depression    Heartburn    Hx of blood clots    Hypertension, essential    Joint pain    Lactose intolerance    Obesity    PMDD (premenstrual dysphoric disorder)    SOBOE (shortness of breath on exertion)    Swelling of both lower extremities    Vitamin D deficiency    Past Surgical History:  Procedure Laterality Date   GASTRIC BYPASS  2006   Social History   Socioeconomic History   Marital status: Married    Spouse name: Mersadie Kavanaugh   Number of children: Not on file   Years of education: Not on file   Highest education level: Not on file  Occupational History   Occupation: Veterinary surgeon in [employer]  Tobacco Use   Smoking status: Never   Smokeless tobacco: Never  Substance and Sexual Activity   Alcohol use: Yes    Comment: occasional liquor   Drug use: No   Sexual activity: Yes    Birth control/protection: Surgical    Comment: VAS  Other Topics Concern   Not on file  Social History Narrative   Not on file    Social Determinants of Health   Financial Resource Strain: Not on file  Food Insecurity: Not on file  Transportation Needs: Not on file  Physical Activity: Not on file  Stress: Not on file  Social Connections: Not on file   Allergies  Allergen Reactions   Codeine    Family History  Problem Relation Age of Onset   Cancer Mother 46       kidney   Diabetes Mother    Hypertension Mother    Kidney disease Mother    Thyroid disease Mother    Kidney cancer Mother    Anxiety disorder Mother    Obesity Mother    Heart attack Father 31   Heart disease Father    Depression Brother    Heart attack Brother    Heart disease Brother     Current Outpatient Medications (Endocrine & Metabolic):    estradiol (CLIMARA - DOSED IN MG/24 HR) 0.0375 mg/24hr patch, 0.0375 mg once a week.   metFORMIN (GLUCOPHAGE) 500 MG tablet, Take 1 tablet (500 mg total) by mouth daily with breakfast.   progesterone (PROMETRIUM) 100 MG capsule, Take 100 mg by mouth daily.  Current Outpatient Medications (Cardiovascular):    amLODipine (NORVASC) 5  MG tablet, Take 1 tablet (5 mg total) by mouth daily.   hydrochlorothiazide (HYDRODIURIL) 25 MG tablet, Take 25 mg by mouth daily.  Current Outpatient Medications (Respiratory):    Loratadine 10 MG CAPS, Take 10 mg by mouth daily.  Current Outpatient Medications (Analgesics):    ibuprofen (ADVIL) 600 MG tablet, Take 1 tablet (600 mg total) by mouth every 6 (six) hours as needed.   meloxicam (MOBIC) 7.5 MG tablet, Take 1 tablet (7.5 mg total) by mouth daily.   Current Outpatient Medications (Other):    busPIRone (BUSPAR) 10 MG tablet, Take 10 mg by mouth 2 (two) times daily.   gabapentin (NEURONTIN) 100 MG capsule, Take 1 capsule (100 mg total) by mouth 2 (two) times daily.   Methylcellulose, Laxative, (CITRUCEL PO), Take 2 tablets by mouth daily.    Multiple Vitamin (MULTIVITAMIN) tablet, Take 1 tablet by mouth daily.    omeprazole (PRILOSEC) 40 MG capsule,  Take 40 mg by mouth daily.   Semaglutide-Weight Management (WEGOVY) 0.5 MG/0.5ML SOAJ, Inject 0.5 mg into the skin once a week.   Vitamin D, Ergocalciferol, (DRISDOL) 1.25 MG (50000 UNIT) CAPS capsule, Take 1 capsule (50,000 Units total) by mouth every 7 (seven) days.   Reviewed prior external information including notes and imaging from  primary care provider As well as notes that were available from care everywhere and other healthcare systems.  Past medical history, social, surgical and family history all reviewed in electronic medical record.  No pertanent information unless stated regarding to the chief complaint.   Review of Systems:  No headache, visual changes, nausea, vomiting, diarrhea, constipation, dizziness, abdominal pain, skin rash, fevers, chills, night sweats, weight loss, swollen lymph nodes, body aches, joint swelling, chest pain, shortness of breath, mood changes. POSITIVE muscle aches  Objective  Blood pressure 120/60, pulse 77, height 5\' 3"  (1.6 m), weight 165 lb (74.8 kg), last menstrual period 10/13/2016, SpO2 98 %.   General: No apparent distress alert and oriented x3 mood and affect normal, dressed appropriately.  HEENT: Pupils equal, extraocular movements intact  Respiratory: Patient's speak in full sentences and does not appear short of breath  Cardiovascular: No lower extremity edema, non tender, no erythema  Shoulder exam does show that patient does have some high riding glenohumeral joint.  Does have some mild crepitus noted.3/ 5 strength of the rotator cuff noted.  Patient's neck exam does have loss of lordosis. Patient does have a positive Spurling's noted.  This is down the left arm.  Radicular symptoms some mild weakness with 3+ out of 5 strength in the C8 distribution noted.   Limited muscular skeletal ultrasound was performed and interpreted by 10/15/2016, M  Limited ultrasound of patient's rotator cuff shows that there is still chronic tearing of  the supraspinatus with some scar tissue formation noted.  There is a relative degree of retraction noted as well.  Severe narrowing and bone-on-bone osteoarthritic changes of the AC joint noted. Impression: Rotator cuff with AC arthropathy.   Impression and Recommendations:    The above documentation has been reviewed and is accurate and complete Antoine Primas, DO

## 2022-10-23 NOTE — Progress Notes (Unsigned)
TeleHealth Visit:  This visit was completed with telemedicine (audio/video) technology. Rebecca Wood has verbally consented to this TeleHealth visit. The patient is located at home, the provider is located at home. The participants in this visit include the listed provider and patient. The visit was conducted today via MyChart video.  OBESITY Rebecca Wood is here to discuss her progress with her obesity treatment plan along with follow-up of her obesity related diagnoses.    Today's visit was # 30  Starting weight: 218 lbs Starting date: 10/20/20 Weight reported at last virtual office visit: 152.9 lbs on 09/26/22 Today's reported weight: 150 lbs  Total weight loss: 68 Weight change since last visit: -2  Weight goal: 140-145 lbs (24-25 BMI)  Nutrition Plan: practicing portion control and making smarter food choices, such as increasing vegetables and decreasing simple carbohydrates.   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  Interim History: Rebecca Wood was able to get the [facility] 0.5 mg weekly recently and reports it has been a big help with her appetite and cravings.  She is snacking less and able to make healthier choices.  She is down almost 3 pounds since her last visit. She has not been exercising but wants to start walking.  Had labs at PCP recently.  I will send for these.  Assessment/Plan:  1. Polyphagia Hunger well-controlled on Wegovy but she feels that the [employer] worked better.  This is because they are the same medication. Medication(s): Wegovy 0.5 mg weekly Has had some mild nausea.  Plan: Refill Wegovy 0.5 mg weekly   2. Vitamin D Deficiency Vitamin D is at goal of 50.  Last vitamin D level was 56.9 on 10/23/2021. She is on weekly prescription Vitamin D 50,000 IU.  Lab Results  Component Value Date   VD25OH 56.9 10/23/2021   VD25OH 34.8 03/08/2021   VD25OH 27.4 (L) 10/20/2020    Plan: Continue prescription  vitamin D 50,000 IU weekly.   3. Obesity: Current BMI 26.6 Rebecca Wood is currently in the action stage of change. As such, her goal is to continue with weight loss efforts.  She has agreed to practicing portion control and making smarter food choices, such as increasing vegetables and decreasing simple carbohydrates.   Exercise goals: Walk for 30 minutes 2 times per week  Behavioral modification strategies: increasing lean protein intake, decreasing simple carbohydrates, and planning for success.  Rebecca Wood has agreed to follow-up with our clinic in 4 weeks.   No orders of the defined types were placed in this encounter.   Medications Discontinued During This Encounter  Medication Reason   Semaglutide-Weight Management (WEGOVY) 0.5 MG/0.5ML SOAJ Reorder     Meds ordered this encounter  Medications   Semaglutide-Weight Management (WEGOVY) 0.5 MG/0.5ML SOAJ    Sig: Inject 0.5 mg into the skin once a week.    Dispense:  2 mL    Refill:  0    Mail Order    Order Specific Question:   Supervising Provider    Answer:   Dell Ponto [[id number]]      Objective:   VITALS: Per patient if applicable, see vitals. GENERAL: Alert and in no acute distress. CARDIOPULMONARY: No increased WOB. Speaking in clear sentences.  PSYCH: Pleasant and cooperative. Speech normal rate and rhythm. Affect is appropriate. Insight and judgement are appropriate. Attention is focused, linear, and appropriate.  NEURO: Oriented as arrived to appointment on time with no prompting.   Lab Results  Component Value Date   CREATININE 0.69 10/23/2021   BUN 10 10/23/2021   NA 139 10/23/2021   K 4.4 10/23/2021   CL 99 10/23/2021   CO2 25 10/23/2021   Lab Results  Component Value Date   ALT 35 (H) 10/23/2021   AST 39 10/23/2021   ALKPHOS 65 10/23/2021   BILITOT 0.8 10/23/2021   Lab Results  Component Value Date   HGBA1C 5.5 10/23/2021   HGBA1C 5.3 10/20/2020   Lab Results  Component Value Date   INSULIN 2.3 (L)  10/23/2021   INSULIN 5.7 10/20/2020   Lab Results  Component Value Date   TSH 1.800 10/23/2021   Lab Results  Component Value Date   CHOL 187 10/23/2021   HDL 73 10/23/2021   LDLCALC 97 10/23/2021   TRIG 94 10/23/2021   CHOLHDL 2.6 10/23/2021   Lab Results  Component Value Date   WBC 6.1 06/08/2021   HGB 13.0 06/08/2021   HCT 39 06/08/2021   MCV 90 10/20/2020   PLT 466 (A) 06/08/2021   Lab Results  Component Value Date   IRON 93 10/23/2021   TIBC 315 10/23/2021   FERRITIN 91 10/23/2021   Lab Results  Component Value Date   VD25OH 56.9 10/23/2021   VD25OH 34.8 03/08/2021   VD25OH 27.4 (L) 10/20/2020    Attestation Statements:   Reviewed by clinician on day of visit: allergies, medications, problem list, medical history, surgical history, family history, social history, and previous encounter notes.

## 2022-10-24 ENCOUNTER — Encounter (INDEPENDENT_AMBULATORY_CARE_PROVIDER_SITE_OTHER): Payer: Self-pay | Admitting: Family Medicine

## 2022-10-24 ENCOUNTER — Telehealth (INDEPENDENT_AMBULATORY_CARE_PROVIDER_SITE_OTHER): Payer: BC Managed Care – PPO | Admitting: Family Medicine

## 2022-10-24 VITALS — Ht 63.0 in | Wt 150.0 lb

## 2022-10-24 DIAGNOSIS — R632 Polyphagia: Secondary | ICD-10-CM | POA: Diagnosis not present

## 2022-10-24 DIAGNOSIS — E559 Vitamin D deficiency, unspecified: Secondary | ICD-10-CM | POA: Diagnosis not present

## 2022-10-24 DIAGNOSIS — E669 Obesity, unspecified: Secondary | ICD-10-CM | POA: Diagnosis not present

## 2022-10-24 DIAGNOSIS — Z6826 Body mass index (BMI) 26.0-26.9, adult: Secondary | ICD-10-CM

## 2022-10-24 MED ORDER — WEGOVY 0.5 MG/0.5ML ~~LOC~~ SOAJ: 0.5000 mg | SUBCUTANEOUS | 0 refills | Status: DC

## 2022-11-13 NOTE — Progress Notes (Deleted)
[place] Phone: [phone] Subjective:    I'm seeing this patient by the request  of:  [name], MD  CC:   [id number]  10/03/2022 Patient does have what appears to be more cervical radiculopathy.  Discussed with patient icing regimen.  We discussed gabapentin, meloxicam low doses.  We discussed Toradol and Depo-Medrol given today as well.  Increase activity slowly otherwise.  Follow-up again 5 to 6 weeks after formal physical therapy as well.  If worsening symptoms or weakness do need to consider the possibility of advanced imaging and epidurals.   Patient does have a rotator cuff tear as well as severe AC arthropathy.  We discussed with patient about different treatment options.  Concern at the moment because patient is having more cervical radiculopathy with a positive Spurling's and weakness of the arm.  Patient will start with home exercises and formal physical therapy.  Did not feel that nitroglycerin was helpful and had side effects.  We discussed that if no significant improvement advanced imaging would need to be warranted especially with this starting back in March and patient being lost to follow-up.  Follow-up with me though again in 6 weeks     Update 11/15/2022 Rebecca Wood is a 57 y.o. female coming in with complaint of L shoulder and neck pain. Patient states   Xray L shoulder 10/03/2022 IMPRESSION: 1. No acute findings. 2. Mild degenerative change of the [facility] join    Past Medical History:  Diagnosis Date   Anxiety    B12 deficiency    Back pain    Chest pain    Depression    Heartburn    Hx of blood clots    Hypertension, essential    Joint pain    Lactose intolerance    Obesity    PMDD (premenstrual dysphoric disorder)    SOBOE (shortness of breath on exertion)    Swelling of both lower extremities    Vitamin D deficiency    Past Surgical History:  Procedure Laterality Date    GASTRIC BYPASS  2006   Social History   Socioeconomic History   Marital status: Married    Spouse name: Lekeshia Huestis   Number of children: Not on file   Years of education: Not on file   Highest education level: Not on file  Occupational History   Occupation: Cabin crew in Air traffic controller  Tobacco Use   Smoking status: Never   Smokeless tobacco: Never  Substance and Sexual Activity   Alcohol use: Yes    Comment: occasional liquor   Drug use: No   Sexual activity: Yes    Birth control/protection: Surgical    Comment: VAS  Other Topics Concern   Not on file  Social History Narrative   Not on file   Social Determinants of Health   Financial Resource Strain: Not on file  Food Insecurity: Not on file  Transportation Needs: Not on file  Physical Activity: Not on file  Stress: Not on file  Social Connections: Not on file   Allergies  Allergen Reactions   Codeine    Family History  Problem Relation Age of Onset   Cancer Mother 57       kidney   Diabetes Mother    Hypertension Mother    Kidney disease Mother    Thyroid disease Mother    Kidney cancer Mother    Anxiety disorder Mother    Obesity Mother  Heart attack Father 45   Heart disease Father    Depression Brother    Heart attack Brother    Heart disease Brother     Current Outpatient Medications (Endocrine & Metabolic):    estradiol (CLIMARA - DOSED IN MG/24 HR) 0.0375 mg/24hr patch, 0.0375 mg once a week.   metFORMIN (GLUCOPHAGE) 500 MG tablet, Take 1 tablet (500 mg total) by mouth daily with breakfast.   progesterone (PROMETRIUM) 100 MG capsule, Take 100 mg by mouth daily.  Current Outpatient Medications (Cardiovascular):    amLODipine (NORVASC) 5 MG tablet, Take 1 tablet (5 mg total) by mouth daily.   hydrochlorothiazide (HYDRODIURIL) 25 MG tablet, Take 25 mg by mouth daily.  Current Outpatient Medications (Respiratory):    Loratadine 10 MG CAPS, Take 10 mg by mouth daily.  Current Outpatient  Medications (Analgesics):    ibuprofen (ADVIL) 600 MG tablet, Take 1 tablet (600 mg total) by mouth every 6 (six) hours as needed.   meloxicam (MOBIC) 7.5 MG tablet, Take 1 tablet (7.5 mg total) by mouth daily.   Current Outpatient Medications (Other):    busPIRone (BUSPAR) 10 MG tablet, Take 10 mg by mouth 2 (two) times daily.   gabapentin (NEURONTIN) 100 MG capsule, Take 1 capsule (100 mg total) by mouth 2 (two) times daily.   Methylcellulose, Laxative, (CITRUCEL PO), Take 2 tablets by mouth daily.    Multiple Vitamin (MULTIVITAMIN) tablet, Take 1 tablet by mouth daily.    omeprazole (PRILOSEC) 40 MG capsule, Take 40 mg by mouth daily.   Semaglutide-Weight Management (WEGOVY) 0.5 MG/0.5ML SOAJ, Inject 0.5 mg into the skin once a week.   Vitamin D, Ergocalciferol, (DRISDOL) 1.25 MG (50000 UNIT) CAPS capsule, Take 1 capsule (50,000 Units total) by mouth every 7 (seven) days.   Reviewed prior external information including notes and imaging from  primary care provider As well as notes that were available from care everywhere and other healthcare systems.  Past medical history, social, surgical and family history all reviewed in electronic medical record.  No pertanent information unless stated regarding to the chief complaint.   Review of Systems:  No headache, visual changes, nausea, vomiting, diarrhea, constipation, dizziness, abdominal pain, skin rash, fevers, chills, night sweats, weight loss, swollen lymph nodes, body aches, joint swelling, chest pain, shortness of breath, mood changes. POSITIVE muscle aches  Objective  Last menstrual period 10/13/2016.   General: No apparent distress alert and oriented x3 mood and affect normal, dressed appropriately.  HEENT: Pupils equal, extraocular movements intact  Respiratory: Patient's speak in full sentences and does not appear short of breath  Cardiovascular: No lower extremity edema, non tender, no erythema      Impression and  Recommendations:

## 2022-11-15 ENCOUNTER — Ambulatory Visit: Payer: BC Managed Care – PPO | Admitting: Family Medicine

## 2022-11-20 DIAGNOSIS — E669 Obesity, unspecified: Secondary | ICD-10-CM | POA: Insufficient documentation

## 2022-11-20 NOTE — Progress Notes (Addendum)
TeleHealth Visit:  This visit was completed with telemedicine (audio/video) technology. Rebecca Wood has verbally consented to this TeleHealth visit. The patient is located at home, the provider is located at home. The participants in this visit include the listed provider and patient. The visit was conducted today via MyChart video.  OBESITY Rebecca Wood is here to discuss her progress with her obesity treatment plan along with follow-up of her obesity related diagnoses.    Today's visit was # 31  Starting weight: 218 lbs Starting date: 10/20/20 Weight reported at last virtual office visit: 150 lbs on 10/24/22 Today's reported weight: 148 lbs  Total weight loss: 70 Weight change since last visit: -2   Weight goal: 140-145 lbs (24-25 BMI)   Nutrition Plan: practicing portion control and making smarter food choices, such as increasing vegetables and decreasing simple carbohydrates.    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  Interim History: Rebecca Wood is nearly at her goal weight of 145 pounds.  She is doing well with eating regular meals, protein intake, water intake. She has a Chemical engineer and we discussed that she may not be able to get [facility] after April 1 because of recent changes.  Assessment/Plan:  1. Anxiety Rebecca Wood mentioned that she would like to see a Social worker.  Reported nothing serious is going on but she feels it would be beneficial for her to have someone to talk to that she can trust.  She currently takes BuSpar 10 mg twice daily.  Has been on Trintellix in the past without effect.  Plan: I gave her a few options: 1.  Call [facility] to set up counseling. 2.  I can refer her to counseling within [facility] health. 3.  I can refer her to Dr. Mallie Mussel our bariatric psychologist. She will consider these options and let me know. She will continue BuSpar at current dose.  2. Polyphagia Appetite well-controlled  on Wegovy 0.5 mg weekly.  Denies side effects.  Will likely not have coverage for this after April 1.  Plan: Refill Wegovy 0.5 mg weekly. She will begin taking every 2 weeks. Discussed option if she no longer has coverage: Zepbound available out-of-pocket if coupon for 550/month.  If she takes every other week will be to 225/month.   3. Obesity: Current BMI 26 Rebecca Wood is currently in the action stage of change. As such, her goal is to continue with weight loss efforts.  She has agreed to practicing portion control and making smarter food choices, such as increasing vegetables and decreasing simple carbohydrates.   Exercise goals: Walk 30 minutes twice weekly  Behavioral modification strategies: increasing lean protein intake, decreasing simple carbohydrates, and planning for success.  [place] has agreed to follow-up with our clinic in 4 weeks.   No orders of the defined types were placed in this encounter.   Medications Discontinued During This Encounter  Medication Reason   metFORMIN (GLUCOPHAGE) 500 MG tablet Patient Preference   Semaglutide-Weight Management (WEGOVY) 0.5 MG/0.5ML SOAJ Reorder     Meds ordered this encounter  Medications   Semaglutide-Weight Management (WEGOVY) 0.5 MG/0.5ML SOAJ    Sig: Inject 0.5 mg into the skin once a week.    Dispense:  2 mL    Refill:  0    Mail Order    Order Specific Question:   Supervising Provider    Answer:   Dell Ponto [id number]      Objective:  VITALS: Per patient if applicable, see vitals. GENERAL: Alert and in no acute distress. CARDIOPULMONARY: No increased WOB. Speaking in clear sentences.  PSYCH: Pleasant and cooperative. Speech normal rate and rhythm. Affect is appropriate. Insight and judgement are appropriate. Attention is focused, linear, and appropriate.  NEURO: Oriented as arrived to appointment on time with no prompting.   Lab Results  Component Value Date   CREATININE 0.7 09/27/2022   BUN 12 09/27/2022   NA  137 09/27/2022   K 4.0 09/27/2022   CL 101 09/27/2022   CO2 9 (A) 09/27/2022   Lab Results  Component Value Date   ALT 30 09/27/2022   AST 28 09/27/2022   ALKPHOS 65 10/23/2021   BILITOT 0.8 10/23/2021   Lab Results  Component Value Date   HGBA1C 5.5 10/23/2021   HGBA1C 5.3 10/20/2020   Lab Results  Component Value Date   INSULIN 2.3 (L) 10/23/2021   INSULIN 5.7 10/20/2020   Lab Results  Component Value Date   TSH 1.48 09/27/2022   Lab Results  Component Value Date   CHOL 185 09/27/2022   HDL 84 (A) 09/27/2022   LDLCALC 83 09/27/2022   TRIG 103 09/27/2022   CHOLHDL 2.6 10/23/2021   Lab Results  Component Value Date   WBC 6.8 09/27/2022   HGB 12.6 09/27/2022   HCT 38 09/27/2022   MCV 90 10/20/2020   PLT 441 (A) 09/27/2022   Lab Results  Component Value Date   IRON 93 10/23/2021   TIBC 315 10/23/2021   FERRITIN 91 10/23/2021   Lab Results  Component Value Date   VD25OH 34.9 09/27/2022   VD25OH 56.9 10/23/2021   VD25OH 34.8 03/08/2021    Attestation Statements:   Reviewed by clinician on day of visit: allergies, medications, problem list, medical history, surgical history, family history, social history, and previous encounter notes.

## 2022-11-21 ENCOUNTER — Encounter (INDEPENDENT_AMBULATORY_CARE_PROVIDER_SITE_OTHER): Payer: Self-pay | Admitting: Family Medicine

## 2022-11-21 ENCOUNTER — Telehealth (INDEPENDENT_AMBULATORY_CARE_PROVIDER_SITE_OTHER): Payer: BC Managed Care – PPO | Admitting: Family Medicine

## 2022-11-21 VITALS — Ht 63.0 in | Wt 148.0 lb

## 2022-11-21 DIAGNOSIS — R632 Polyphagia: Secondary | ICD-10-CM | POA: Insufficient documentation

## 2022-11-21 DIAGNOSIS — Z6826 Body mass index (BMI) 26.0-26.9, adult: Secondary | ICD-10-CM | POA: Diagnosis not present

## 2022-11-21 DIAGNOSIS — F419 Anxiety disorder, unspecified: Secondary | ICD-10-CM | POA: Diagnosis not present

## 2022-11-21 DIAGNOSIS — E669 Obesity, unspecified: Secondary | ICD-10-CM

## 2022-11-21 MED ORDER — WEGOVY 0.5 MG/0.5ML ~~LOC~~ SOAJ: 0.5000 mg | SUBCUTANEOUS | 0 refills | Status: DC

## 2022-12-18 NOTE — Progress Notes (Signed)
TeleHealth Visit:  This visit was completed with telemedicine (audio/video) technology. Rebecca Rebecca [name] has verbally consented to this TeleHealth visit. The patient Rebecca [name] located at home, the provider Rebecca [name] located at home. The participants in this visit include the listed provider and patient. The visit was conducted today via MyChart video.  OBESITY Rebecca Rebecca [name] Rebecca [name] here to discuss her progress with her obesity treatment plan along with follow-up of her obesity related diagnoses.    Today's visit was # 63  Starting weight: 218 lbs Starting date: 10/20/20 Weight reported at last virtual office visit: 148 lbs on 11/21/22 Today's reported weight: 148 lbs  Total weight loss: 70 Weight change since last visit: 0   Weight goal: 140-145 lbs (24-25 BMI)   Nutrition Plan: practicing portion control and making smarter food choices, such as increasing vegetables and decreasing simple carbohydrates.    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  Interim History:  Rebecca Rebecca [name] reports Rebecca Rebecca [name] has been snacking on carbohydrates more than Rebecca Rebecca [name] should.  Rebecca Rebecca [name] Rebecca [name] taking Wegovy every other week to conserve her supply.  Rebecca Rebecca [name] will no longer have coverage for [facility] after April 1.  Rebecca Rebecca [name] notes increased hunger the second week after her Wegovy injection. Rebecca Rebecca [name] eats 3 balanced meals per day with vegetables.  Eating all of the prescribed protein: yes  Skipping meals: No Drinking sugar sweetened beverages: No Hunger controlled: moderately controlled. Cravings controlled:  moderately controlled.  Pharmacotherapy: Rebecca Rebecca [name] on Wegovy 0.50 mg SQ every other week. Adverse side effects: None Hunger Rebecca [name] moderately controlled.  Has some metformin left over from before Rebecca Rebecca [name] started [facility]. Assessment/Plan:  1. Insulin Resistance Has very mild insulin resistance.  Rebecca Rebecca [name] reports polyphagia. Medication(s): Wegovy 0.5 mg every other week.  Lab Results  Component Value Date   HGBA1C 5.5  10/23/2021   HGBA1C 5.3 10/20/2020   Lab Results  Component Value Date   INSULIN 2.3 (L) 10/23/2021   INSULIN 5.7 10/20/2020    Plan Refill Wegovy 0.5 mg weekly. Rebecca Rebecca [name] Rebecca [name] to take it every other week. Take metformin once daily with food to see if it helps with polyphagia.  2. Polyphagia Rebecca Rebecca [name] endorses excessive hunger.  Medication(s): Wegovy 0.5 mg every other week. moderately controlled. Cravings are moderately controlled.   Plan: Continue and refill Wegovy 0.5 mg weekly.  Rebecca Rebecca [name] will take every other week. Start metformin 500 mg daily with meals.   3. Generalized Obesity: Current BMI 26 Pharmacotherapy Plan Continue and refill Wegovy 0.50 mg SQ weekly.  Take injection every other week. Rebecca Rebecca [name] currently in the action stage of change. As such, her goal Rebecca [name] to continue with weight loss efforts.  Rebecca Rebecca [name] has agreed to practicing portion control and making smarter food choices, such as increasing vegetables and decreasing simple carbohydrates.  1.  Keep healthy protein snacks at work. 2.  Be more diligent with healthy diet since Rebecca Rebecca [name] Rebecca [name] taking Wegovy less often.  Exercise goals: All adults should avoid inactivity. Some physical activity Rebecca [name] better than none, and adults who participate in any amount of physical activity gain some health benefits.  Behavioral modification strategies: increasing lean protein intake, decreasing simple carbohydrates , better snacking choices, and planning for success.  Rebecca has agreed to follow-up with our clinic in 4 weeks.   No orders of the defined types were placed in this encounter.   Medications Discontinued During This Encounter  Medication Reason   Semaglutide-Weight Management (WEGOVY) 0.5 MG/0.5ML SOAJ Reorder  Meds ordered this encounter  Medications   Semaglutide-Weight Management (WEGOVY) 0.5 MG/0.5ML SOAJ    Sig: Inject 0.5 mg into the skin once a week.    Dispense:  6 mL    Refill:  0    Mail Order    Order Specific Question:    Supervising Provider    Answer:   Dell Ponto [[id number]]      Objective:   VITALS: Per patient if applicable, see vitals. GENERAL: Alert and in no acute distress. CARDIOPULMONARY: No increased WOB. Speaking in clear sentences.  PSYCH: Pleasant and cooperative. Speech normal rate and rhythm. Affect Rebecca [name] appropriate. Insight and judgement are appropriate. Attention Rebecca [name] focused, linear, and appropriate.  NEURO: Oriented as arrived to appointment on time with no prompting.   Attestation Statements:   Reviewed by clinician on day of visit: allergies, medications, problem list, medical history, surgical history, family history, social history, and previous encounter notes.   This was prepared with the assistance of Dragon [employer].  Occasional wrong-word or sound-a-like substitutions may have occurred due to the inherent limitations of voice recognition software.

## 2022-12-19 ENCOUNTER — Telehealth (INDEPENDENT_AMBULATORY_CARE_PROVIDER_SITE_OTHER): Payer: BC Managed Care – PPO | Admitting: Family Medicine

## 2022-12-19 ENCOUNTER — Encounter (INDEPENDENT_AMBULATORY_CARE_PROVIDER_SITE_OTHER): Payer: Self-pay | Admitting: Family Medicine

## 2022-12-19 VITALS — Ht 63.0 in | Wt 148.0 lb

## 2022-12-19 DIAGNOSIS — R632 Polyphagia: Secondary | ICD-10-CM | POA: Diagnosis not present

## 2022-12-19 DIAGNOSIS — Z6826 Body mass index (BMI) 26.0-26.9, adult: Secondary | ICD-10-CM | POA: Diagnosis not present

## 2022-12-19 DIAGNOSIS — E669 Obesity, unspecified: Secondary | ICD-10-CM | POA: Diagnosis not present

## 2022-12-19 DIAGNOSIS — E88819 Insulin resistance, unspecified: Secondary | ICD-10-CM | POA: Diagnosis not present

## 2022-12-19 MED ORDER — WEGOVY 0.5 MG/0.5ML ~~LOC~~ SOAJ: 0.5000 mg | SUBCUTANEOUS | 0 refills | Status: DC

## 2022-12-21 ENCOUNTER — Ambulatory Visit: Payer: BC Managed Care – PPO | Admitting: Family Medicine

## 2023-01-16 NOTE — Progress Notes (Signed)
TeleHealth Visit:  This visit was completed with telemedicine (audio/video) technology. Rebecca Wood has verbally consented to this TeleHealth visit. The patient is located at home, the provider is located at home. The participants in this visit include the listed provider and patient. The visit was conducted today via MyChart video.  OBESITY Rebecca Wood is here to discuss her progress with her obesity treatment plan along with follow-up of her obesity related diagnoses.   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 She is taking it every other week to conserve her supply and notes increased hunger second week.  She no longer has coverage for [facility]. Protein intake is good. Snacks more second week after injection. She has maintained her weight.  Weight is 3 pounds above her goal.  She is fearful about regaining her weight without Wegovy.  We discussed paying out-of-pocket for Wegovy or Zepbound and she is considering this.  Pharmacotherapy: Rebecca Wood is on Wegovy 0.50 mg SQ weekly. Adverse side effects: None Hunger is moderately controlled.  Assessment/Plan:  1. Insulin Resistance Last fasting insulin was 2.3. A1c was 5.5.. Polyphagia:Yes Medication(s): Prescribed metformin but has not started taking it. Lab Results  Component Value Date   HGBA1C 5.5 10/23/2021   HGBA1C 5.3 10/20/2020   Lab Results  Component Value Date   INSULIN 2.3 (L) 10/23/2021   INSULIN  5.7 10/20/2020    Plan: Start metformin to see if it helps with polyphagia.  Recommend starting on weekend in case she has GI side effects.   2. Hypertension Hypertension well controlled.  Medication(s): Amlodipine 5 mg daily, HCTZ 25 mg daily.  BP Readings from Last 3 Encounters:  10/03/22 120/60  12/26/21 120/70  12/20/21 113/71   Lab Results  Component Value Date   CREATININE 0.7 09/27/2022   CREATININE 0.69 10/23/2021   CREATININE 0.7 06/08/2021   No results found for: "GFR"  Plan: Continue all antihypertensives at current dosages.   3. Generalized Obesity: Current BMI 26 Pharmacotherapy Plan Continue Wegovy 0.50 mg SQ weekly.  She has 5 more injections and she will take this every other week. Rebecca Wood is currently in the action stage of change. As such, her goal is to continue with weight loss efforts.  She has agreed to practicing portion control and making smarter food choices, such as increasing vegetables and decreasing simple carbohydrates.  Exercise goals: Encouraged her to start exercise.  Behavioral modification strategies: increasing lean protein intake, decreasing simple carbohydrates , and planning for success.  Rebecca Wood has agreed to follow-up with our clinic in 4 weeks.   No orders of the defined types were placed in this encounter.   There are no discontinued medications.   No orders of the defined types were placed in this encounter.     Objective:   VITALS: Per patient if applicable, see vitals. GENERAL: Alert and in no acute distress. CARDIOPULMONARY: No increased WOB. Speaking in clear sentences.  PSYCH: Pleasant and cooperative. Speech normal rate and rhythm. Affect is appropriate.  Insight and judgement are appropriate. Attention is focused, linear, and appropriate.  NEURO: Oriented as arrived to appointment on time with no prompting.   Attestation Statements:   Reviewed by clinician on day of visit: allergies, medications, problem list,  medical history, surgical history, family history, social history, and previous encounter notes.  Time spent on visit including the items listed below was 30 minutes.  -preparing to see the patient (e.g., review of tests, history, previous notes) -obtaining and/or reviewing separately obtained history -counseling and educating the patient/family/caregiver -documenting clinical information in the electronic or other health record -ordering medications, tests, or procedures -independently interpreting results and communicating results to the patient/ family/caregiver -referring and communicating with other health care professionals  -care coordination    This was prepared with the assistance of Dragon [employer].  Occasional wrong-word or sound-a-like substitutions may have occurred due to the inherent limitations of voice recognition software.

## 2023-01-17 ENCOUNTER — Telehealth (INDEPENDENT_AMBULATORY_CARE_PROVIDER_SITE_OTHER): Payer: BC Managed Care – PPO | Admitting: Family Medicine

## 2023-01-17 ENCOUNTER — Encounter (INDEPENDENT_AMBULATORY_CARE_PROVIDER_SITE_OTHER): Payer: Self-pay | Admitting: Family Medicine

## 2023-01-17 VITALS — Ht 63.0 in | Wt 148.0 lb

## 2023-01-17 DIAGNOSIS — I10 Essential (primary) hypertension: Secondary | ICD-10-CM

## 2023-01-17 DIAGNOSIS — E88819 Insulin resistance, unspecified: Secondary | ICD-10-CM

## 2023-01-17 DIAGNOSIS — Z6826 Body mass index (BMI) 26.0-26.9, adult: Secondary | ICD-10-CM | POA: Diagnosis not present

## 2023-01-17 DIAGNOSIS — E669 Obesity, unspecified: Secondary | ICD-10-CM | POA: Diagnosis not present

## 2023-02-15 ENCOUNTER — Ambulatory Visit: Payer: BC Managed Care – PPO | Admitting: Family Medicine

## 2023-02-20 NOTE — Progress Notes (Signed)
TeleHealth Visit:  This visit was completed with telemedicine (audio/video) technology. Rebecca Wood has verbally consented to this TeleHealth visit. The patient is located at home, the provider is located at home. The participants in this visit include the listed provider and patient. The visit was conducted today via MyChart video.  OBESITY Rebecca Wood is here to discuss her progress with her obesity treatment plan along with follow-up of her obesity related diagnoses.   Today's visit was # 34 Starting weight: 218 lbs Starting date: 10/20/20 Weight at last in office visit: 148 lbs on 01/17/23 Total weight loss: 68 lbs at last in office visit on 01/17/23. 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 for long weekend and is up a few lbs.  She is still taking Wegovy 0.5 mg every 14 days and has 2 injections left.  She no longer has insurance coverage for [employer]. Protein intake is good. Eats 3 meals per day.  Hunger and cravings pretty well-controlled despite taking Wegovy every 2 weeks.  Pharmacotherapy: Rebecca Wood is on Wegovy 0.50 mg SQ weekly Adverse side effects: None Hunger is moderately controlled.  Cravings are moderately controlled.  Assessment/Plan:  1. Insulin Resistance Last fasting insulin was 2.3 on 10/23/2021.. A1c was 5.5. Polyphagia:No Medication(s): Metformin 500 mg once daily breakfast -tolerating metformin well.  Generally takes it the second week after her injection of Wegovy.  She feels it may be helping with polyphagia. Lab Results  Component Value Date   HGBA1C 5.5 10/23/2021   HGBA1C 5.3 10/20/2020   Lab Results  Component Value Date    INSULIN 2.3 (L) 10/23/2021   INSULIN 5.7 10/20/2020    Plan: Continue and refill Metformin 500 mg once daily breakfast May continue to take daily every other week.  2. Vitamin D Deficiency Vitamin D is not at goal of 50.  Most recent vitamin D level was 34.9 on 09/27/2022. She is on  prescription ergocalciferol 50,000 IU weekly. Lab Results  Component Value Date   VD25OH 34.9 09/27/2022   VD25OH 56.9 10/23/2021   VD25OH 34.8 03/08/2021    Plan: Continue and refill  prescription ergocalciferol 50,000 IU weekly Check vitamin D level.  She will go to office to have labs drawn before next visit.  3. Generalized Obesity: Current BMI 26  Pharmacotherapy Plan Continue  Wegovy 0.50 mg SQ weekly every 14 days.  Rebecca Wood is currently in the action stage of change. As such, her goal is to continue with weight loss efforts.  She has agreed to practicing portion control and making smarter food choices, such as increasing vegetables and decreasing simple carbohydrates.  Exercise goals:  as is  Behavioral modification strategies: increasing lean protein intake, decreasing simple carbohydrates , meal planning , and planning for success.  Rebecca Wood has agreed to follow-up with our clinic in 4 weeks.   Orders Placed This Encounter  Procedures   VITAMIN D 25 Hydroxy (Vit-D Deficiency, Fractures)    Medications Discontinued During This Encounter  Medication Reason   Vitamin D, Ergocalciferol, (DRISDOL) 1.25 MG (50000 UNIT) CAPS capsule Reorder   metFORMIN (GLUCOPHAGE) 500 MG tablet Reorder     Meds ordered this encounter  Medications  Vitamin D, Ergocalciferol, (DRISDOL) 1.25 MG (50000 UNIT) CAPS capsule    Sig: Take 1 capsule (50,000 Units total) by mouth every 7 (seven) days.    Dispense:  12 capsule    Refill:  0    Order Specific Question:   Supervising Provider    Answer:   Glennis Brink [[id number]]   metFORMIN (GLUCOPHAGE) 500 MG tablet    Sig: Take 1 tablet (500 mg total) by mouth  daily with breakfast.    Dispense:  90 tablet    Refill:  0    Order Specific Question:   Supervising Provider    Answer:   Glennis Brink [[id number]]      Objective:   VITALS: Per patient if applicable, see vitals. GENERAL: Alert and in no acute distress. CARDIOPULMONARY: No increased WOB. Speaking in clear sentences.  PSYCH: Pleasant and cooperative. Speech normal rate and rhythm. Affect is appropriate. Insight and judgement are appropriate. Attention is focused, linear, and appropriate.  NEURO: Oriented as arrived to appointment on time with no prompting.   Attestation Statements:   Reviewed by clinician on day of visit: allergies, medications, problem list, medical history, surgical history, family history, social history, and previous encounter notes.  This was prepared with the assistance of Engineer, civil (consulting).  Occasional wrong-word or sound-a-like substitutions may have occurred due to the inherent limitations of voice recognition software.

## 2023-02-21 ENCOUNTER — Telehealth (INDEPENDENT_AMBULATORY_CARE_PROVIDER_SITE_OTHER): Payer: BC Managed Care – PPO | Admitting: Family Medicine

## 2023-02-21 ENCOUNTER — Encounter (INDEPENDENT_AMBULATORY_CARE_PROVIDER_SITE_OTHER): Payer: Self-pay | Admitting: Family Medicine

## 2023-02-21 VITALS — Ht 63.0 in | Wt 150.0 lb

## 2023-02-21 DIAGNOSIS — E559 Vitamin D deficiency, unspecified: Secondary | ICD-10-CM | POA: Diagnosis not present

## 2023-02-21 DIAGNOSIS — E669 Obesity, unspecified: Secondary | ICD-10-CM | POA: Diagnosis not present

## 2023-02-21 DIAGNOSIS — E88819 Insulin resistance, unspecified: Secondary | ICD-10-CM

## 2023-02-21 DIAGNOSIS — Z6826 Body mass index (BMI) 26.0-26.9, adult: Secondary | ICD-10-CM | POA: Diagnosis not present

## 2023-02-21 MED ORDER — VITAMIN D (ERGOCALCIFEROL) 1.25 MG (50000 UNIT) PO CAPS: 50000.0000 [IU] | ORAL_CAPSULE | ORAL | 0 refills | Status: DC

## 2023-02-21 MED ORDER — METFORMIN HCL 500 MG PO TABS: 500.0000 mg | ORAL_TABLET | Freq: Every day | ORAL | 0 refills | Status: DC

## 2023-03-15 ENCOUNTER — Ambulatory Visit: Payer: BC Managed Care – PPO | Admitting: Family Medicine

## 2023-03-20 NOTE — Progress Notes (Signed)
TeleHealth Visit:  This visit was completed with telemedicine (audio/video) technology. Rebecca Wood has verbally consented to this TeleHealth visit. The patient is located at home, the provider is located at home. The participants in this visit include the listed provider and patient. The visit was conducted today via MyChart video.  OBESITY Rebecca Wood is here to discuss her progress with her obesity treatment plan along with follow-up of her obesity related diagnoses.    Today's visit was # 35  Starting weight: 218 lbs Starting date: 10/20/20 Weight reported at last virtual office visit: 150.4 lbs on 02/21/23 Today's reported weight (03/21/23):  151 lbs Total weight loss: 67 lbs Weight change since last visit: +1 lbs  Goal weight 140-145 pounds.  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 Walks 10 minutes daily at work.  Interim History:  She has one dose of Wegovy left- taking every 2 weeks. Feels hungrier the second week.  She lost insurance coverage for [facility] April 1.  She feels the metformin is helping with polyphagia. Protein intake is good. Has protein at all meals. Portions are larger since she is taking the [facility] every 14 days. Has big family reunion in [place] in July.  She has added in 10 minutes of walking daily at work.  Assessment/Plan:  1. Polyphagia Currently this is moderately controlled. Medication(s): Wegovy 0.50 mg SQ weekly-taking every 14 days.  Has 1 injection left.  Notes increased hunger and larger portion sizes.  Reported side effects: None On metformin once daily and feels it is helping with polyphagia.  Plan: Continue and increase dose Metformin 500 mg twice daily with meals  2. Generalized Obesity: Current BMI 26 She is considering paying out-of-pocket for  Wegovy or Zepbound.  She will download a Wegovy coupon to see how much cost savings it provides.  Rebecca Wood is currently in the action stage of change. As such, her goal is to continue with weight loss efforts.  She has agreed to practicing portion control and making smarter food choices, such as increasing vegetables and decreasing simple carbohydrates.  Exercise goals: Continue to increase walking.  Behavioral modification strategies: increasing lean protein intake, decreasing simple carbohydrates , and planning for success.  Rebecca Wood has agreed to follow-up with our clinic in 4 weeks.  No orders of the defined types were placed in this encounter.   Medications Discontinued During This Encounter  Medication Reason   metFORMIN (GLUCOPHAGE) 500 MG tablet Reorder     Meds ordered this encounter  Medications   metFORMIN (GLUCOPHAGE) 500 MG tablet    Sig: Take 1 tablet (500 mg total) by mouth 2 (two) times daily with a meal.    Dispense:  180 tablet    Refill:  0    Order Specific Question:   Supervising Provider    Answer:   Glennis Brink [[id number]]      Objective:   VITALS: Per patient if applicable, see vitals. GENERAL: Alert and in no acute distress. CARDIOPULMONARY: No increased WOB. Speaking in clear sentences.  PSYCH: Pleasant and cooperative. Speech normal rate and rhythm. Affect is appropriate. Insight and judgement are appropriate. Attention is focused, linear, and appropriate.  NEURO: Oriented as arrived to appointment on time with no prompting.   Attestation Statements:   Reviewed by clinician on day of visit: allergies, medications, problem list,  medical history, surgical history, family history, social history, and previous encounter notes.  Time spent on visit including the items listed below was 30 minutes.  -preparing to see the patient (e.g., review of tests, history, previous notes) -obtaining and/or reviewing separately obtained history -counseling and educating the  patient/family/caregiver -documenting clinical information in the electronic or other health record -ordering medications, tests, or procedures -independently interpreting results and communicating results to the patient/ family/caregiver -referring and communicating with other health care professionals  -care coordination    This was prepared with the assistance of Dragon [employer].  Occasional wrong-word or sound-a-like substitutions may have occurred due to the inherent limitations of voice recognition software.

## 2023-03-21 ENCOUNTER — Telehealth (INDEPENDENT_AMBULATORY_CARE_PROVIDER_SITE_OTHER): Payer: BC Managed Care – PPO | Admitting: Family Medicine

## 2023-03-21 ENCOUNTER — Encounter (INDEPENDENT_AMBULATORY_CARE_PROVIDER_SITE_OTHER): Payer: Self-pay | Admitting: Family Medicine

## 2023-03-21 VITALS — Ht 63.0 in | Wt 151.0 lb

## 2023-03-21 DIAGNOSIS — R632 Polyphagia: Secondary | ICD-10-CM | POA: Diagnosis not present

## 2023-03-21 DIAGNOSIS — E669 Obesity, unspecified: Secondary | ICD-10-CM

## 2023-03-21 DIAGNOSIS — Z6826 Body mass index (BMI) 26.0-26.9, adult: Secondary | ICD-10-CM

## 2023-03-21 MED ORDER — METFORMIN HCL 500 MG PO TABS: 500.0000 mg | ORAL_TABLET | Freq: Two times a day (BID) | ORAL | 0 refills | Status: DC

## 2023-04-24 ENCOUNTER — Telehealth (INDEPENDENT_AMBULATORY_CARE_PROVIDER_SITE_OTHER): Payer: BC Managed Care – PPO | Admitting: Family Medicine

## 2023-04-25 ENCOUNTER — Telehealth (INDEPENDENT_AMBULATORY_CARE_PROVIDER_SITE_OTHER): Payer: BC Managed Care – PPO | Admitting: Family Medicine

## 2023-04-26 ENCOUNTER — Ambulatory Visit: Payer: BC Managed Care – PPO | Admitting: Family Medicine

## 2023-04-30 NOTE — Progress Notes (Unsigned)
TeleHealth Visit:  This visit was completed with telemedicine (audio/video) technology. Rebecca Wood has verbally consented to this TeleHealth visit. The patient is located at home, the provider is located at home. The participants in this visit include the listed provider and patient. The visit was conducted today via MyChart video.  OBESITY Rebecca Wood is here to discuss her progress with her obesity treatment plan along with follow-up of her obesity related diagnoses.   Today's visit was # 36 Starting weight: 218 lbs Starting date: 10/20/20 Weight at last virtual visit: 151 lbs on 03/21/23 Today's reported weight (05/01/23):  153.1 lbs Total weight loss: 65  Goal weight 140-145 pounds.   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 Walks 10 minutes daily at work.  Interim History:  Last Wegovy injection was over a month ago. She reports increased hunger- portion sizes larger, snacking more. She has lost motivation. Has a lot of stress at work. On plan at breakfast and lunch. Takes lunch to work most of the time. Dining out less in the evenings.  Assessment/Plan:  1. Hypertension Hypertension well controlled.  121/80 this am. Medication(s): Amlodipine 5 mg daily, HCTZ 25 mg daily.  BP Readings from Last 3 Encounters:  10/03/22 120/60  12/26/21 120/70  12/20/21 113/71   Lab Results  Component Value Date   CREATININE 0.7 09/27/2022   CREATININE 0.69 10/23/2021   CREATININE 0.7 06/08/2021   No results found for: "GFR"  Plan: Continue all antihypertensives at current dosages.  2.  Polyphagia Poorly controlled since being off of Wegovy due to cost. Medication(s): Metformin increased to 500 mg twice daily at last visit but she is still only taking 1 dose in the morning. Lab  Results  Component Value Date   HGBA1C 5.5 10/23/2021   HGBA1C 5.3 10/20/2020   Lab Results  Component Value Date   INSULIN 2.3 (L) 10/23/2021   INSULIN 5.7 10/20/2020    Plan: Begin taking metformin 500 mg twice daily rather than once daily to help with polyphagia.   3. Generalized Obesity: Current BMI 27  Rebecca Wood is currently in the action stage of change. As such, her goal is to continue with weight loss efforts.  She has agreed to practicing portion control and making smarter food choices, such as increasing vegetables and decreasing simple carbohydrates.  1.  When dining out have only the sandwich and no [place] fries. 2.  Shop for groceries on a full stomach.  Exercise goals:  increase NEAT.  Behavioral modification strategies: increasing lean protein intake, decreasing simple carbohydrates , decrease eating out, meal planning , and planning for success.  Rebecca Wood has agreed to follow-up with our clinic in 4 weeks.  No orders of the defined types were placed in this encounter.   There are no discontinued medications.   No orders of the defined types were placed in this encounter.     Objective:   VITALS: Per patient if applicable, see vitals. GENERAL: Alert and in no acute distress. CARDIOPULMONARY: No increased WOB. Speaking in clear sentences.  PSYCH: Pleasant and cooperative. Speech normal rate and rhythm. Affect is appropriate. Insight and judgement are appropriate. Attention is focused, linear, and appropriate.  NEURO: Oriented as arrived to appointment on time with no prompting.   Attestation Statements:   Reviewed by clinician on day of visit:  allergies, medications, problem list, medical history, surgical history, family history, social history, and previous encounter notes.  Time spent on visit including the items listed below was 30 minutes.  -preparing to see the patient (e.g., review of tests, history, previous notes) -obtaining and/or reviewing  separately obtained history -counseling and educating the patient/family/caregiver -documenting clinical information in the electronic or other health record -ordering medications, tests, or procedures -independently interpreting results and communicating results to the patient/ family/caregiver -referring and communicating with other health care professionals  -care coordination   This was prepared with the assistance of Engineer, civil (consulting).  Occasional wrong-word or sound-a-like substitutions may have occurred due to the inherent limitations of voice recognition software.

## 2023-05-01 ENCOUNTER — Telehealth (INDEPENDENT_AMBULATORY_CARE_PROVIDER_SITE_OTHER): Payer: BC Managed Care – PPO | Admitting: Family Medicine

## 2023-05-01 ENCOUNTER — Encounter (INDEPENDENT_AMBULATORY_CARE_PROVIDER_SITE_OTHER): Payer: Self-pay | Admitting: Family Medicine

## 2023-05-01 VITALS — Ht 63.0 in | Wt 153.0 lb

## 2023-05-01 DIAGNOSIS — Z6827 Body mass index (BMI) 27.0-27.9, adult: Secondary | ICD-10-CM

## 2023-05-01 DIAGNOSIS — E669 Obesity, unspecified: Secondary | ICD-10-CM | POA: Diagnosis not present

## 2023-05-01 DIAGNOSIS — R632 Polyphagia: Secondary | ICD-10-CM | POA: Diagnosis not present

## 2023-05-01 DIAGNOSIS — I10 Essential (primary) hypertension: Secondary | ICD-10-CM | POA: Diagnosis not present

## 2023-05-24 ENCOUNTER — Ambulatory Visit: Payer: BC Managed Care – PPO | Admitting: Family Medicine

## 2023-05-30 ENCOUNTER — Telehealth (INDEPENDENT_AMBULATORY_CARE_PROVIDER_SITE_OTHER): Payer: BC Managed Care – PPO | Admitting: Family Medicine

## 2023-06-06 ENCOUNTER — Telehealth (INDEPENDENT_AMBULATORY_CARE_PROVIDER_SITE_OTHER): Payer: BC Managed Care – PPO | Admitting: Family Medicine

## 2023-06-10 NOTE — Progress Notes (Signed)
TeleHealth Visit:  This visit was completed with telemedicine (audio/video) technology. Rebecca Wood has verbally consented to this TeleHealth visit. The patient is located at home, the provider is located at home. The participants in this visit include the listed provider and patient. The visit was conducted today via MyChart video.  OBESITY Rebecca Wood is here to discuss her progress with her obesity treatment plan along with follow-up of her obesity related diagnoses.    Today's visit was # 37  Starting weight: 218 lbs Starting date: 10/20/20 Weight reported at last virtual office visit: 153.1 lbs on 05/01/23 Today's reported weight (06/11/23):  159.8 lbs Total weight loss: 59 lbs Weight change since last visit: +6 lbs  Goal weight 140-145 pounds.   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 Her weight is up. She knows that she is stress eating. She wants to get on a more structured plan.   Assessment/Plan:  1. Polyphagia Currently this is poorly controlled. Medication(s): Metformin 500 mg twice daily with meals.  Reported side effects: None  Plan: Continue and refill Metformin 500 mg twice daily with meals Start Zepbound 5 mg weekly. Make take every other week for cost savings. \ 2. Vitamin D Deficiency Vitamin D is not at goal of 50.  Most recent vitamin D level was 34.9. She is on  prescription ergocalciferol 50,000 IU weekly. Lab Results  Component Value Date   VD25OH 34.9 09/27/2022   VD25OH 56.9 10/23/2021   VD25OH 34.8 03/08/2021   Plan: Continue and refill  prescription ergocalciferol 50,000 IU weekly  3. Generalized Obesity: Current BMI 28  Pharmacotherapy Plan Start Zepbound 5.0 mg SQ  weekly.  She will pay OOP and download coupon. Make take every other week for cost savings.  Rebecca Wood is currently in the action stage of change. As such, her goal is to continue with weight loss efforts.  She has agreed to keeping a food journal with goal of 1500-1600 calories and 100 grams of protein daily.  Exercise goals: walk for 30 minutes 3 days per week.  Behavioral modification strategies: increasing lean protein intake, decreasing simple carbohydrates , and keep a strict food journal.  Rebecca Wood has agreed to follow-up with our clinic in 5 weeks.  No orders of the defined types were placed in this encounter.   Medications Discontinued During This Encounter  Medication Reason   meloxicam (MOBIC) 7.5 MG tablet Patient Preference   gabapentin (NEURONTIN) 100 MG capsule Patient Preference   Vitamin D, Ergocalciferol, (DRISDOL) 1.25 MG (50000 UNIT) CAPS capsule Reorder   metFORMIN (GLUCOPHAGE) 500 MG tablet Reorder     Meds ordered this encounter  Medications   tirzepatide (ZEPBOUND) 5 MG/0.5ML Pen    Sig: Inject 5 mg into the skin once a week.    Dispense:  2 mL    Refill:  1    Order Specific Question:   Supervising Provider    Answer:   Carolin Sicks   metFORMIN (GLUCOPHAGE) 500 MG tablet    Sig: Take 1 tablet (500 mg total) by mouth 2 (two) times daily with a meal.    Dispense:  180 tablet    Refill:  0    Order Specific Question:   Supervising Provider  Answer:   Glennis Brink [[id number]]   Vitamin D, Ergocalciferol, (DRISDOL) 1.25 MG (50000 UNIT) CAPS capsule    Sig: Take 1 capsule (50,000 Units total) by mouth every 7 (seven) days.    Dispense:  12 capsule    Refill:  0    Order Specific Question:   Supervising Provider    Answer:   Glennis Brink [[id number]]      Objective:   VITALS: Per patient if applicable, see vitals. GENERAL: Alert and in no acute distress. CARDIOPULMONARY: No increased WOB. Speaking in clear sentences.  PSYCH: Pleasant and cooperative.  Speech normal rate and rhythm. Affect is appropriate. Insight and judgement are appropriate. Attention is focused, linear, and appropriate.  NEURO: Oriented as arrived to appointment on time with no prompting.   Attestation Statements:   Reviewed by clinician on day of visit: allergies, medications, problem list, medical history, surgical history, family history, social history, and previous encounter notes.  This was prepared with the assistance of [employer].  Occasional wrong-word or sound-a-like substitutions may have occurred due to the inherent limitations of voice recognition software.

## 2023-06-11 ENCOUNTER — Telehealth (INDEPENDENT_AMBULATORY_CARE_PROVIDER_SITE_OTHER): Payer: BC Managed Care – PPO | Admitting: Family Medicine

## 2023-06-11 ENCOUNTER — Encounter (INDEPENDENT_AMBULATORY_CARE_PROVIDER_SITE_OTHER): Payer: Self-pay | Admitting: Family Medicine

## 2023-06-11 VITALS — Ht 63.0 in | Wt 159.0 lb

## 2023-06-11 DIAGNOSIS — E669 Obesity, unspecified: Secondary | ICD-10-CM

## 2023-06-11 DIAGNOSIS — Z6828 Body mass index (BMI) 28.0-28.9, adult: Secondary | ICD-10-CM

## 2023-06-11 DIAGNOSIS — R632 Polyphagia: Secondary | ICD-10-CM | POA: Diagnosis not present

## 2023-06-11 DIAGNOSIS — E559 Vitamin D deficiency, unspecified: Secondary | ICD-10-CM

## 2023-06-11 MED ORDER — ZEPBOUND 5 MG/0.5ML ~~LOC~~ SOAJ: 5.0000 mg | SUBCUTANEOUS | 1 refills | Status: DC

## 2023-06-11 MED ORDER — METFORMIN HCL 500 MG PO TABS: 500.0000 mg | ORAL_TABLET | Freq: Two times a day (BID) | ORAL | 0 refills | Status: AC

## 2023-06-11 MED ORDER — VITAMIN D (ERGOCALCIFEROL) 1.25 MG (50000 UNIT) PO CAPS
50000.0000 [IU] | ORAL_CAPSULE | ORAL | 0 refills | Status: AC
Start: 2023-06-11 — End: ?

## 2023-06-25 ENCOUNTER — Ambulatory Visit: Payer: BC Managed Care – PPO | Admitting: Family Medicine

## 2023-08-18 IMAGING — DX DG CERVICAL SPINE 2 OR 3 VIEWS
3 series · 3 of 3 positions shown · non-contrast
Comparison: No prior.

CLINICAL DATA: Neck/shoulder pain. Bilateral upper arm pain. No
known injury.

EXAM:
CERVICAL SPINE - 2-3 VIEW

[c-spine lat]
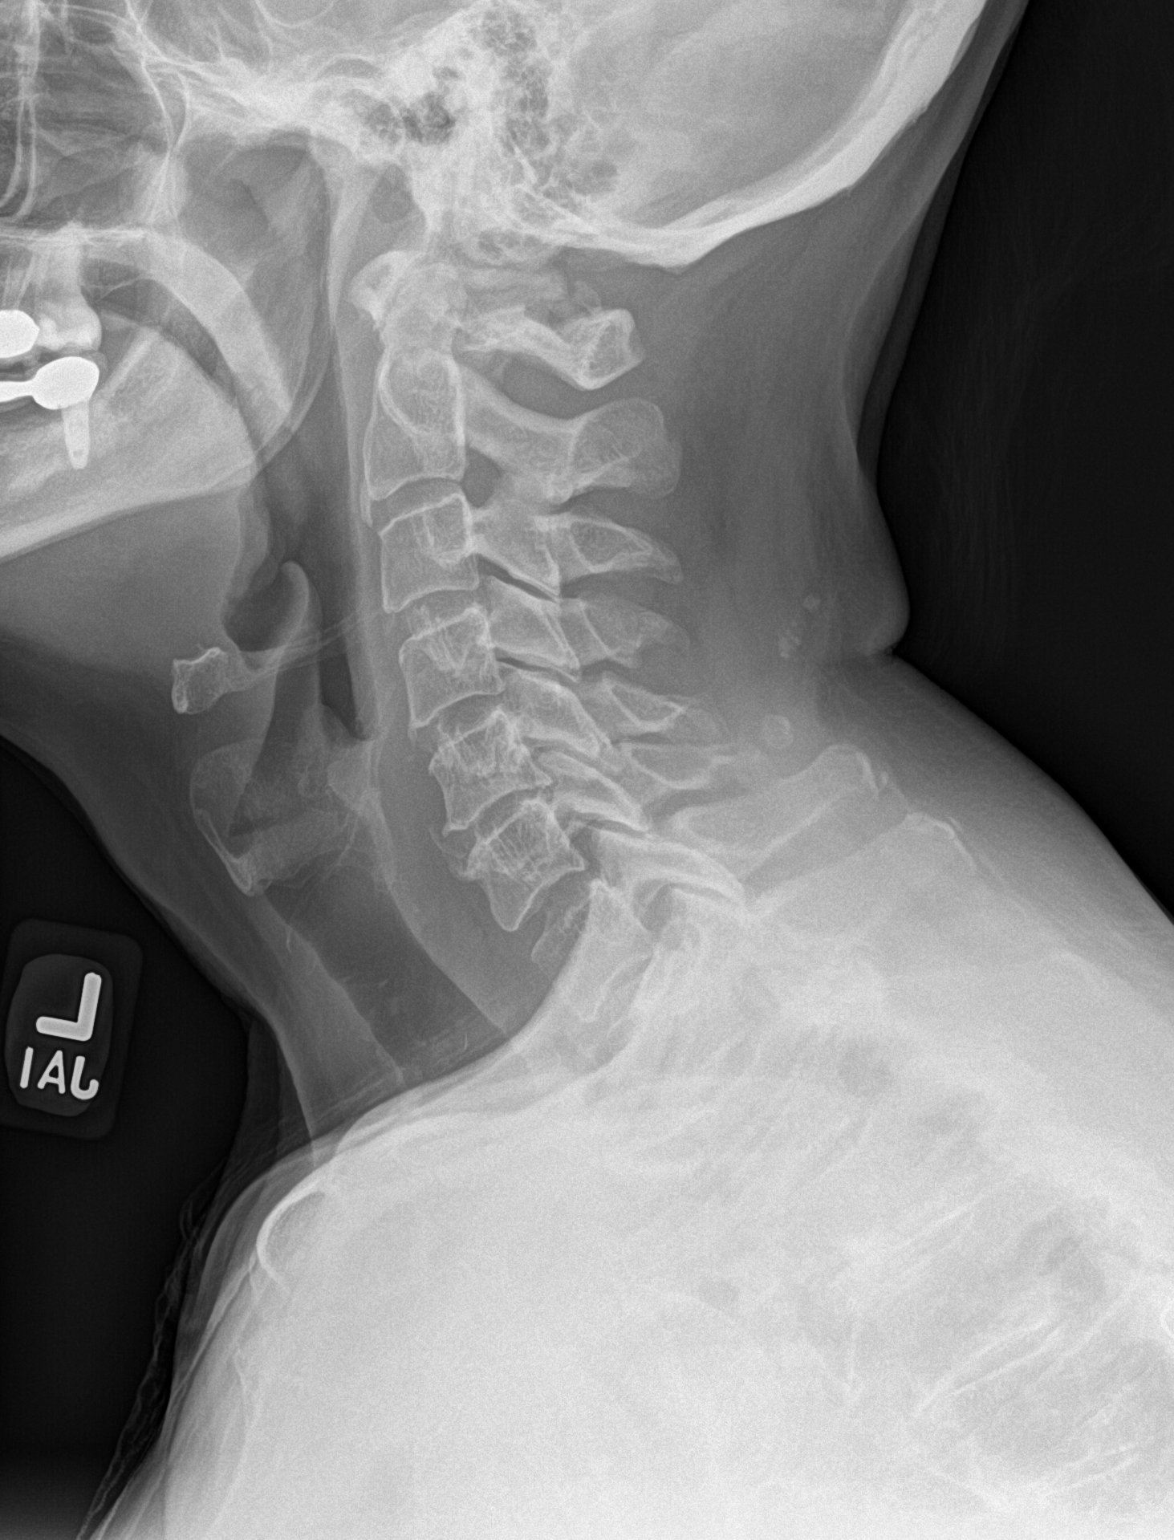

[c-spine ap]
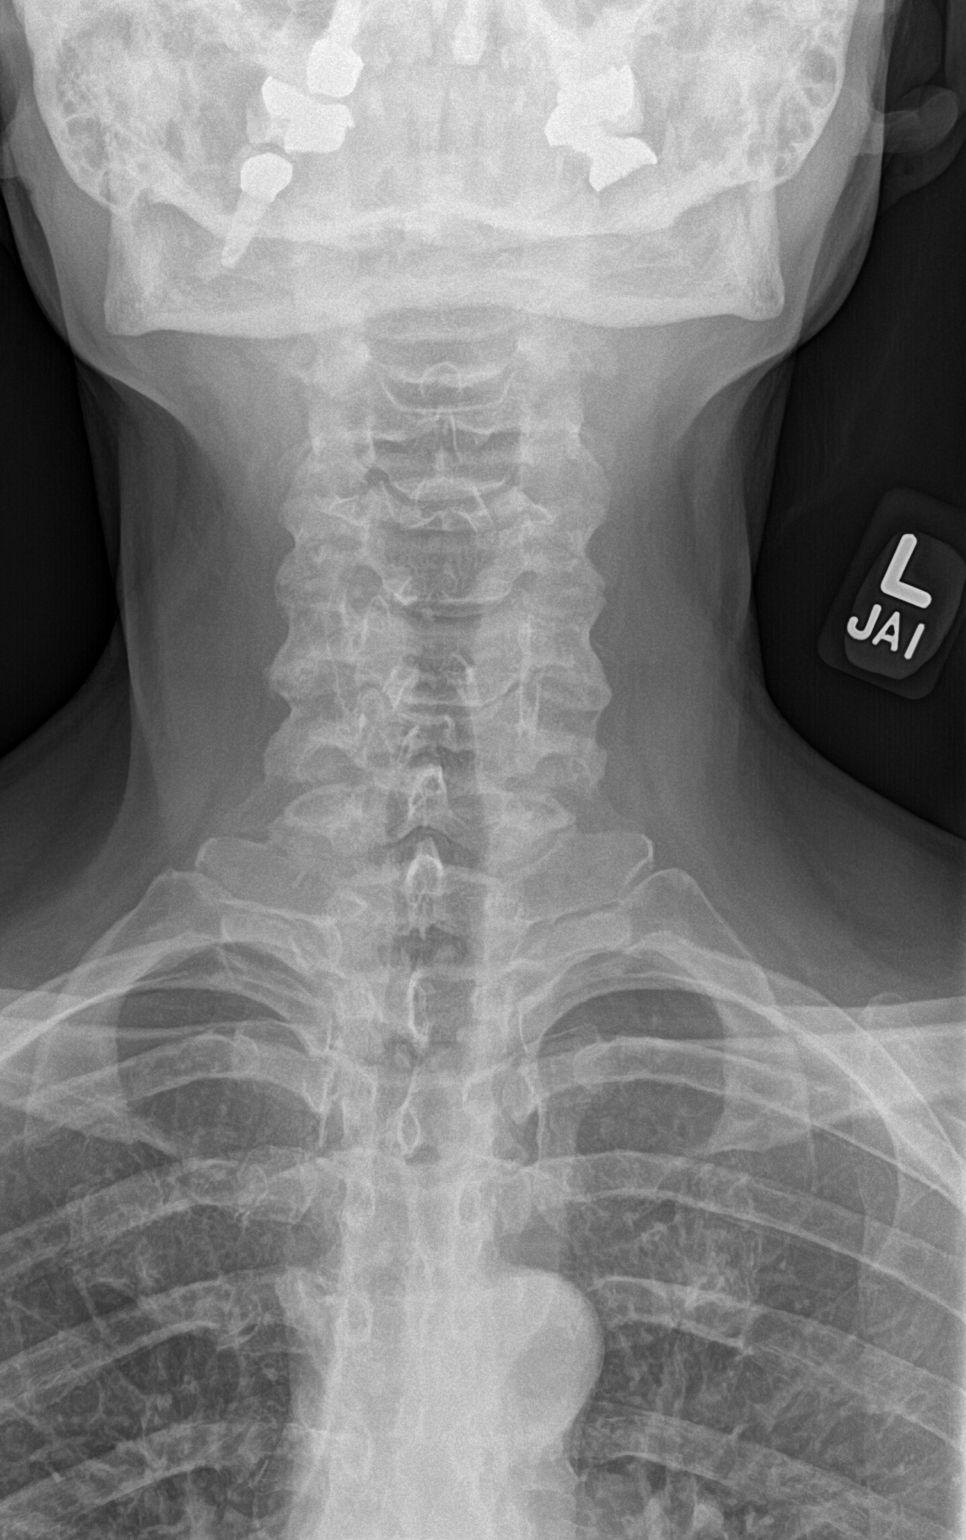

[c-spine open mouth]
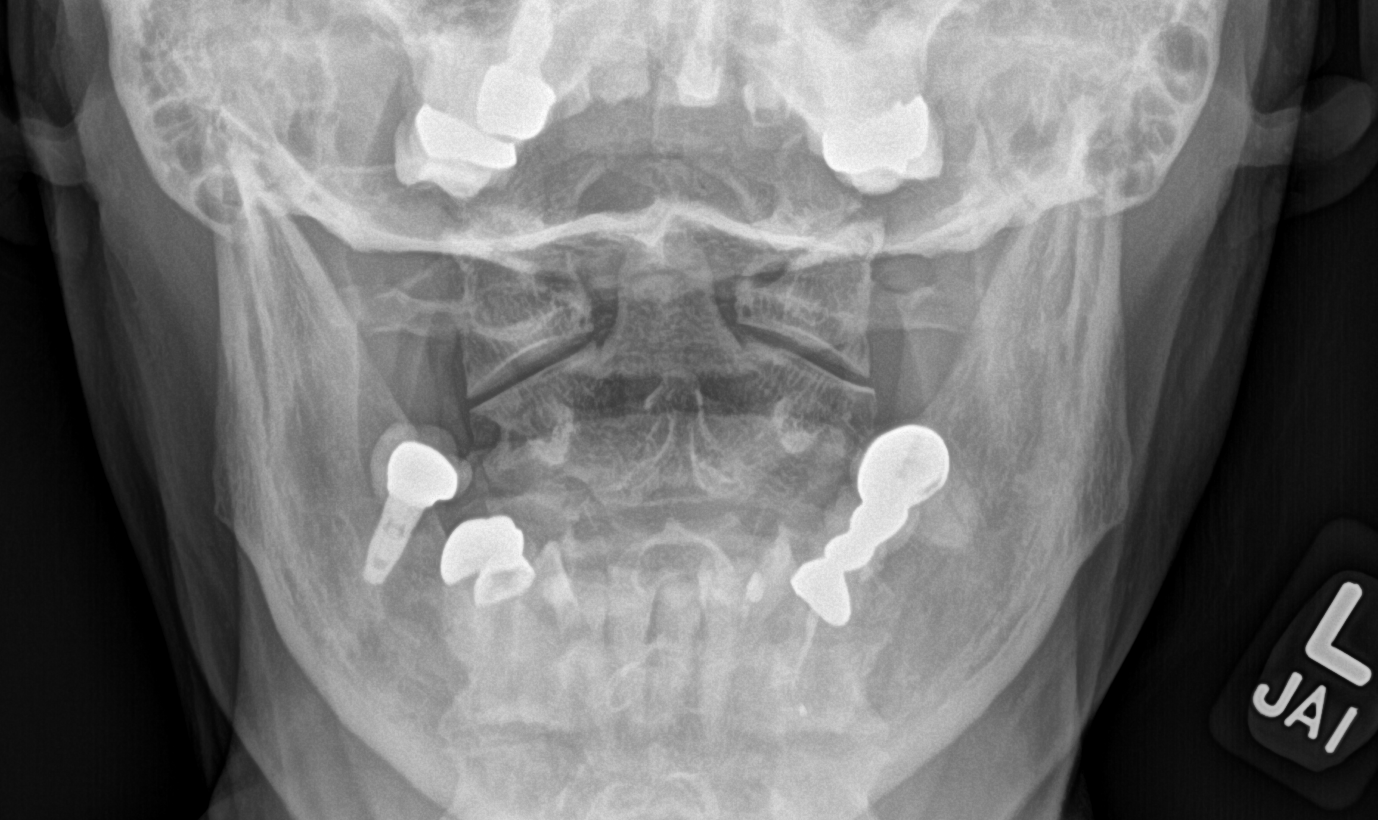

[3 of 3 positions shown; findings below may reference images not displayed]

FINDINGS: Diffuse multilevel disc degeneration, endplate osteophyte formation,
and facet hypertrophy. Disc degeneration and endplate osteophyte
formation most prominent at C5-C6. No acute bony abnormality. No
evidence of fracture dislocation. Ligamentous ossification noted.
Pulmonary apices are clear.
IMPRESSION: Diffuse multilevel degenerative change. Degenerative changes most
prominent at C5-C6. No acute abnormality identified.

## 2023-09-30 ENCOUNTER — Other Ambulatory Visit (HOSPITAL_COMMUNITY): Payer: Self-pay | Admitting: Family Medicine

## 2023-09-30 DIAGNOSIS — R55 Syncope and collapse: Secondary | ICD-10-CM

## 2023-10-03 ENCOUNTER — Encounter: Payer: Self-pay | Admitting: Neurology

## 2023-10-03 ENCOUNTER — Ambulatory Visit: Payer: BC Managed Care – PPO | Admitting: Neurology

## 2023-10-03 VITALS — BP 124/70 | HR 75 | Ht 62.0 in | Wt 162.0 lb

## 2023-10-03 DIAGNOSIS — R4020 Unspecified coma: Secondary | ICD-10-CM | POA: Diagnosis not present

## 2023-10-03 DIAGNOSIS — R0683 Snoring: Secondary | ICD-10-CM | POA: Diagnosis not present

## 2023-10-03 DIAGNOSIS — R419 Unspecified symptoms and signs involving cognitive functions and awareness: Secondary | ICD-10-CM

## 2023-10-03 DIAGNOSIS — G4719 Other hypersomnia: Secondary | ICD-10-CM

## 2023-10-03 NOTE — Progress Notes (Signed)
[facility]  PATIENT: Rebecca Wood DOB: [birth year]  REFERRING DOCTOR OR PCP: Garth Bigness, MD; SOURCE: Patient, notes from primary care  _________________________________   HISTORICAL  CHIEF COMPLAINT:  Chief Complaint  Patient presents with   Room 10    Pt is here Alone. Pt states that last Wednesday night she lost consciousness twice. Pt states that she hit her head twice that night. Pt denies any new symptoms since her episode. Pt states that she wants to discuss about CT. Pt states that she is concerned about her memory and would like to discuss.     HISTORY OF PRESENT ILLNESS:  I had the pleasure of seeing your patient, Rebecca Wood, at [facility] for neurologic consultation regarding her 2 episodes of loss of consciousness.  She is a 57 year old woman who had 2 episodes of LOC on 09/25/2023.  She stood up after watching TV and started to walk to the bathroom.  She then collapsed without warning. Husband did not see the fall but heard the fall and saw her a few seconds later.   He had not noted convulsions.    She hit her head and was unresponsive x 90 seconds.    Over a couple more minutes she felt back to baseline.    A few minutes she went to the bathroom and sat down on the toilet to defecate.  She lost consciousness hitting her head, damaging her tooth and hurting her ribs.    Her husband heard the fall and immediately went inside the bathroom.  He had told her that she was out x 5 minutes.  A few minutes after she regained consciousness she felt close to baseline but more tired.  She went to bed.  She discussed with her primary care.  She has an MRI of the brain and MR angiogram set up.  No convulsions noted.  No incontinence.  No tongue biting.  She did not note any aura or lightheadedness before the episode  She has only had 2 other episodes of LOC, one of them was in 2006 a day after gastric bypass surgery when she went to take a  shower.  She felt lightheaded before that episode.  Another episode occurred when she had a blood draw and she also felt lightheaded right before she lost consciousness without episode.Marland Kitchen      She would like to also discuss that she has noted some issues with memory and focus/attention.   She reports having a 3 hour neurocognitive testing earlier this year and was told she did not have any dementia.     She sleeps poorly some nights.   She snores but husband has not told her she has snoring, gasping.  Pauses in her breathing.  She has no depression but has anxiety and takes Buspar.     EPWORTH SLEEPINESS SCALE  On a scale of 0 - 3 what is the chance of dozing:  Sitting and Reading:   3 Watching TV:    3 Sitting inactive in a public place: 0 Passenger in car for one hour: 0 Lying down to rest in the afternoon: 3 Sitting and talking to someone: 0 Sitting quietly after lunch:  3 In a car, stopped in traffic:  0  Total (out of 24):    12/24   mild EDS   REVIEW OF SYSTEMS: Constitutional: No fevers, chills, sweats, or change in appetite Eyes: No visual changes, double vision, eye pain Ear, nose and throat:  No hearing loss, ear pain, nasal congestion, sore throat Cardiovascular: No chest pain, palpitations Respiratory:  No shortness of breath at rest or with exertion.   No wheezes.  She has snoring. GastrointestinaI: No nausea, vomiting, diarrhea, abdominal pain, fecal incontinence Genitourinary:  No dysuria, urinary retention or frequency.  No nocturia. Musculoskeletal:  No neck pain, back pain Integumentary: No rash, pruritus, skin lesions Neurological: as above Psychiatric: No depression at this time.  She has some anxiety Endocrine: No palpitations, diaphoresis, change in appetite, change in weigh or increased thirst Hematologic/Lymphatic:  No anemia, purpura, petechiae. Allergic/Immunologic: No itchy/runny eyes, nasal congestion, recent allergic reactions,  rashes  ALLERGIES: Allergies  Allergen Reactions   Codeine     HOME MEDICATIONS:  Current Outpatient Medications:    amLODipine (NORVASC) 5 MG tablet, Take 1 tablet (5 mg total) by mouth daily., Disp: 90 tablet, Rfl:    busPIRone (BUSPAR) 10 MG tablet, Take 10 mg by mouth 2 (two) times daily., Disp: , Rfl:    estradiol (CLIMARA - DOSED IN MG/24 HR) 0.0375 mg/24hr patch, 0.0375 mg once a week., Disp: , Rfl:    hydrochlorothiazide (HYDRODIURIL) 25 MG tablet, Take 25 mg by mouth daily., Disp: , Rfl:    ibuprofen (ADVIL) 600 MG tablet, Take 1 tablet (600 mg total) by mouth every 6 (six) hours as needed., Disp: 30 tablet, Rfl: 0   Loratadine 10 MG CAPS, Take 10 mg by mouth daily., Disp: , Rfl:    metFORMIN (GLUCOPHAGE) 500 MG tablet, Take 1 tablet (500 mg total) by mouth 2 (two) times daily with a meal. (Patient taking differently: Take 500 mg by mouth daily.), Disp: 180 tablet, Rfl: 0   Methylcellulose, Laxative, (CITRUCEL PO), Take 2 tablets by mouth daily. , Disp: , Rfl:    Multiple Vitamin (MULTIVITAMIN) tablet, Take 1 tablet by mouth daily. , Disp: , Rfl:    omeprazole (PRILOSEC) 40 MG capsule, Take 40 mg by mouth daily., Disp: , Rfl:    progesterone (PROMETRIUM) 100 MG capsule, Take 100 mg by mouth daily., Disp: , Rfl:    Semaglutide-Weight Management (WEGOVY) 0.25 MG/0.5ML SOAJ, Inject 0.25 mg into the skin once a week., Disp: , Rfl:    Vitamin D, Ergocalciferol, (DRISDOL) 1.25 MG (50000 UNIT) CAPS capsule, Take 1 capsule (50,000 Units total) by mouth every 7 (seven) days., Disp: 12 capsule, Rfl: 0   tirzepatide (ZEPBOUND) 5 MG/0.5ML Pen, Inject 5 mg into the skin once a week., Disp: 2 mL, Rfl: 1  PAST MEDICAL HISTORY: Past Medical History:  Diagnosis Date   Anxiety    B12 deficiency    Back pain    Chest pain    Depression    Heartburn    Hx of blood clots    Hypertension, essential    Joint pain    Lactose intolerance    Obesity    PMDD (premenstrual dysphoric disorder)     SOBOE (shortness of breath on exertion)    Swelling of both lower extremities    Vitamin D deficiency     PAST SURGICAL HISTORY: Past Surgical History:  Procedure Laterality Date   GASTRIC BYPASS  2006    FAMILY HISTORY: Family History  Problem Relation Age of Onset   Cancer Mother 73       kidney   Diabetes Mother    Hypertension Mother    Kidney disease Mother    Thyroid disease Mother    Kidney cancer Mother    Anxiety disorder Mother  Obesity Mother    Heart attack Father 57   Heart disease Father    Depression Brother    Heart attack Brother    Heart disease Brother     SOCIAL HISTORY: Social History   Socioeconomic History   Marital status: Married    Spouse name: Ismenia Benavides   Number of children: Not on file   Years of education: Not on file   Highest education level: Not on file  Occupational History   Occupation: Veterinary surgeon in [employer]  Tobacco Use   Smoking status: Never   Smokeless tobacco: Never  Substance and Sexual Activity   Alcohol use: Yes    Comment: occasional liquor   Drug use: No   Sexual activity: Yes    Birth control/protection: Surgical    Comment: VAS  Other Topics Concern   Not on file  Social History Narrative   Not on file   Social Drivers of Health   Financial Resource Strain: Not on file  Food Insecurity: Not on file  Transportation Needs: Not on file  Physical Activity: Not on file  Stress: Not on file  Social Connections: Not on file  Intimate Partner Violence: Not on file       PHYSICAL EXAM  Vitals:   10/03/23 1144  BP: 124/70  Pulse: 75  Weight: 162 lb (73.5 kg)  Height: 5\' 2"  (1.575 m)    Body mass index is 29.63 kg/m.  ORTHOSTATICS Supine 142/77   pulse 68 Stand 30s  127/79   pulse 78 Stand 120 s     132/84     pulse 73  General: The patient is well-developed and well-nourished and in no acute distress  HEENT:  Head is [place]/AT.  Sclera are anicteric.     Neck: No carotid bruits are  noted.  The neck is nontender.  Cardiovascular: The heart has a regular rate and rhythm with a normal S1 and S2. There were no murmurs, gallops or rubs.    Skin: Extremities are without rash or  edema.  Musculoskeletal:  Back is nontender  Neurologic Exam  Mental status: The patient is alert and oriented x 3 at the time of the examination. The patient has apparent normal recent and remote memory, with an apparently normal attention span and concentration ability.   Speech is normal.  Cranial nerves: Extraocular movements are full. Pupils are equal, round, and reactive to light and accomodation.  Visual fields are full.  Facial symmetry is present. There is good facial sensation to soft touch bilaterally.Facial strength is normal.  Trapezius and sternocleidomastoid strength is normal. No dysarthria is noted.  The tongue is midline, and the patient has symmetric elevation of the soft palate. No obvious hearing deficits are noted.  Motor:  Muscle bulk is normal.   Tone is normal. Strength is  5 / 5 in all 4 extremities.   Sensory: Sensory testing is intact to pinprick, soft touch and vibration sensation in all 4 extremities.  Coordination: Cerebellar testing reveals good finger-nose-finger and heel-to-shin bilaterally.  Gait and station: Station is normal.   Gait is normal. Tandem gait is normal. Romberg is negative.   Reflexes: Deep tendon reflexes are symmetric and normal bilaterally.   Plantar responses are flexor.    DIAGNOSTIC DATA (LABS, IMAGING, TESTING) - I reviewed patient records, labs, notes, testing and imaging myself where available.  Lab Results  Component Value Date   WBC 6.8 09/27/2022   HGB 12.6 09/27/2022   HCT 38 09/27/2022  MCV 90 10/20/2020   PLT 441 (A) 09/27/2022      Component Value Date/Time   NA 137 09/27/2022 0000   K 4.0 09/27/2022 0000   CL 101 09/27/2022 0000   CO2 9 (A) 09/27/2022 0000   GLUCOSE 78 10/23/2021 0851   GLUCOSE 88 01/09/2013 0744    BUN 12 09/27/2022 0000   CREATININE 0.7 09/27/2022 0000   CREATININE 0.69 10/23/2021 0851   CREATININE 0.59 01/09/2013 0744   CALCIUM 9.5 10/23/2021 0851   PROT 6.6 10/23/2021 0851   ALBUMIN 4.3 09/27/2022 0000   ALBUMIN 4.4 10/23/2021 0851   AST 28 09/27/2022 0000   ALT 30 09/27/2022 0000   ALKPHOS 65 10/23/2021 0851   BILITOT 0.8 10/23/2021 0851   GFRNONAA 99 06/08/2021 0000   GFRAA 115 10/20/2020 1028   Lab Results  Component Value Date   CHOL 185 09/27/2022   HDL 84 (A) 09/27/2022   LDLCALC 83 09/27/2022   TRIG 103 09/27/2022   CHOLHDL 2.6 10/23/2021   Lab Results  Component Value Date   HGBA1C 5.5 10/23/2021   Lab Results  Component Value Date   VITAMINB12 805 09/27/2022   Lab Results  Component Value Date   TSH 1.48 09/27/2022       ASSESSMENT AND PLAN  LOC (loss of consciousness) (HCC) - Plan: EEG adult  Snoring - Plan: Home sleep test  Excessive daytime sleepiness - Plan: Home sleep test  Cognitive complaints  In summary, Ms. Boerema is a 57 year old woman who had 2 episodes of loss of consciousness, back-to-back, last week.  The etiology is uncertain.  She did not have any witnessed seizure activity and there was not a prolonged period of confusion afterwards.  Therefore, seizure is unlikely.  However, due to this possibility we do need to check an EEG.  She was concerned about the out-of-pocket expense for the upcoming MRI.  She could get the MRI of the brain and hold off on the MR angiogram if indicated or if she continues to have episodes.  The second issue is subjective memory loss.  I felt our interaction today was completely normal and showed no clue that she had any cognitive issue.  Most likely she has reduced focus/attention causing her to feel memory is impaired.  At her age, the most likely possibilities to cause his arm poor sleep and mood disturbance.  She does snore and was once very overweight.  We will check a home sleep study to determine  if she has obstructive sleep apnea and treat if indicated.  She will return to see [place] as needed based on the results of the studies.  She should call us if she has new or worsening neurologic symptoms or continues to experience these episodes.   Sanora Cunanan A. Epimenio Foot, MD, [facility] 10/03/2023, 4:06 PM Certified in Neurology, Clinical Neurophysiology, Sleep Medicine and Neuroimaging  [facility] [address], [address] [place], [place] [address] [phone]

## 2023-10-07 ENCOUNTER — Ambulatory Visit (HOSPITAL_COMMUNITY): Payer: BC Managed Care – PPO

## 2023-10-18 ENCOUNTER — Ambulatory Visit (HOSPITAL_COMMUNITY)
Admission: RE | Admit: 2023-10-18 | Discharge: 2023-10-18 | Disposition: A | Discharge status: Home / Self Care | Payer: 59 | Source: Ambulatory Visit | Attending: Family Medicine

## 2023-10-18 ENCOUNTER — Ambulatory Visit (HOSPITAL_COMMUNITY)
Admission: RE | Admit: 2023-10-18 | Discharge: 2023-10-18 | Disposition: A | Discharge status: Home / Self Care | Payer: 59 | Source: Ambulatory Visit | Attending: Family Medicine | Admitting: Family Medicine

## 2023-10-18 DIAGNOSIS — R55 Syncope and collapse: Secondary | ICD-10-CM | POA: Diagnosis present

## 2023-10-18 MED ORDER — GADOBUTROL 1 MMOL/ML IV SOLN
7.0000 mL | Freq: Once | INTRAVENOUS | Status: AC | PRN
  Administered 2023-10-18: 7 mL via INTRAVENOUS

## 2023-10-23 ENCOUNTER — Encounter: Payer: Self-pay | Admitting: Neurology

## 2023-10-23 ENCOUNTER — Telehealth: Payer: Self-pay | Admitting: *Deleted

## 2023-10-23 NOTE — Telephone Encounter (Signed)
 Received FYI notification from [facility] @ [facility] that MRI and MRA head results are final. They are able to be viewed in the chart. No abnormal findings noted.

## 2023-10-28 ENCOUNTER — Telehealth: Payer: Self-pay | Admitting: Neurology

## 2023-10-28 NOTE — Telephone Encounter (Signed)
 LVM and sent mychart msg asking pt to call back to schedule follow up due to insurance requiring it before sleep study  If pt calls back, you can schedule her for a video visit with either Dr. Vickey Huger or Dr. Frances Furbish in a sleep consult slot per [facility]

## 2023-11-11 ENCOUNTER — Ambulatory Visit: Payer: 59 | Admitting: Neurology

## 2023-11-11 DIAGNOSIS — R4020 Unspecified coma: Secondary | ICD-10-CM

## 2023-11-18 ENCOUNTER — Encounter: Payer: Self-pay | Admitting: Neurology

## 2023-11-18 NOTE — Progress Notes (Signed)
   [facility]  EEG (ELECTROENCEPHALOGRAM) REPORT   STUDY DATE: 11/11/2023 PATIENT NAME: Rebecca Wood DOB: 1965/11/12 MRN: [id number]  ORDERING CLINICIAN: Shiann Kam A. Epimenio Foot, MD. PhD  TECHNOLOGIST: Marianne Sofia, REEGT  TECHNIQUE: Electroencephalogram was recorded utilizing standard 10-20 system of lead placement and reformatted into average and bipolar montages.  RECORDING TIME: 24 minutes 37 seconds  CLINICAL INFORMATION: 58 year old woman with episode of loss of consciousness  FINDINGS: A digital EEG was performed while the patient was awake and drowsy. While awake and most alert there was a 9 1/2 hz posterior dominant rhythm. Voltages and frequencies were symmetric.  There were no focal, lateralizing, epileptiform activity or seizures seen.  Photic stimulation had a normal driving response. Hyperventilation and recovery did not change the underlying rhythms. EKG channel shows normal sinus rhythm.  She remained alert throughout the study.  IMPRESSION: This is a normal EEG while the patient was awake.   INTERPRETING PHYSICIAN:   Edith Groleau A. Epimenio Foot, MD, PhD, [facility] Certified in Neurology, Clinical Neurophysiology, Sleep Medicine, Pain Medicine and Neuroimaging  [facility] [address] [place], [place] [address] [phone]

## 2023-11-21 ENCOUNTER — Other Ambulatory Visit (HOSPITAL_COMMUNITY): Payer: Self-pay

## 2023-12-02 NOTE — Telephone Encounter (Signed)
Called and spoke w/ pt about normal EEG. She did get mychart message from 11/18/23 even though system says she had not read.

## 2023-12-30 ENCOUNTER — Ambulatory Visit: Payer: 59 | Admitting: Neurology

## 2023-12-30 ENCOUNTER — Encounter: Payer: Self-pay | Admitting: Neurology

## 2023-12-30 VITALS — Ht 63.0 in | Wt 161.0 lb

## 2023-12-30 DIAGNOSIS — R4020 Unspecified coma: Secondary | ICD-10-CM

## 2023-12-30 DIAGNOSIS — R0683 Snoring: Secondary | ICD-10-CM

## 2023-12-30 DIAGNOSIS — G4719 Other hypersomnia: Secondary | ICD-10-CM

## 2023-12-30 DIAGNOSIS — R419 Unspecified symptoms and signs involving cognitive functions and awareness: Secondary | ICD-10-CM

## 2023-12-30 NOTE — Progress Notes (Signed)
 [facility]  PATIENT: Rebecca Wood DOB: Apr 18, 1966  REFERRING DOCTOR OR PCP: Garth Bigness, MD; SOURCE: Patient, notes from primary care  _________________________________   HISTORICAL  CHIEF COMPLAINT:  Chief Complaint  Patient presents with   Loss of Consciousness    Pt in 10 alone Pt here for loss of consciouness and review results of EEG,MRI brain,MR angiogram     HISTORY OF PRESENT ILLNESS:  Rebecca Wood is a 58 y.o. woman 2 episodes of loss of consciousness.  Update 12/30/2023 She has not ha any more episodes of syncope since 09/25/2023  She continues to note some memory lapses and also has reduced focus/attention.   She reports having a 3 hour neurocognitive testing earlier this year and was told she did not have any dementia.     She sleeps poorly some nights.     She snores but husband has not told her she has snoring, gasping or pauses in her breathing.    A Home study was ordered but authorization was difficult as it would have crossed the calendar year so she has not yet done the study.   She denies much depression but has anxiety and takes Buspar.   B12, vit D and TSH were fine 09/2023.      MRI brain 10/2023 showed T2/FLAIR hyperintense foci predominantly in the subcortical white matter of the frontal lobes.  1 focus was in the periventricular white matter.  None of these appear to be acute.  MR angiogram was normal  H/o syncope She is a 58 year old woman who had 2 episodes of LOC on 09/25/2023.  She stood up after watching TV and started to walk to the bathroom.  She then collapsed without warning. Husband did not see the fall but heard the fall and saw her a few seconds later.   He had not noted convulsions.    She hit her head and was unresponsive x 90 seconds.    Over a couple more minutes she felt back to baseline.    A few minutes she went to the bathroom and sat down on the toilet to defecate.  She lost consciousness hitting her head,  damaging her tooth and hurting her ribs.    Her husband heard the fall and immediately went inside the bathroom.  He had told her that she was out x 5 minutes.  A few minutes after she regained consciousness she felt close to baseline but more tired.  She went to bed.  She discussed with her primary care.   No convulsions noted.  No incontinence.  No tongue biting.  She did not note any aura or lightheadedness before the episode  She has only had 2 other episodes of LOC, one of them was in 2006 a day after gastric bypass surgery when she went to take a shower.  She felt lightheaded before that episode.  Another episode occurred when she had a blood draw and she also felt lightheaded right before she lost consciousness without episode..        IMAGING: MRI of the brain 10/18/2023 showed some T2/FLAIR hyperintense foci predominantly in the subcortical white matter of the frontal lobes.  1 focus was in the periventricular white matter.  None of these appear to be acute.  MRI angiogram of head 10/18/2023 was normal.   EPWORTH SLEEPINESS SCALE  On a scale of 0 - 3 what is the chance of dozing:  Sitting and Reading:   3 Watching TV:  3 Sitting inactive in a public place: 0 Passenger in car for one hour: 0 Lying down to rest in the afternoon: 3 Sitting and talking to someone: 0 Sitting quietly after lunch:  3 In a car, stopped in traffic:  0  Total (out of 24):    12/24 consistent with mild EDS   REVIEW OF SYSTEMS: Constitutional: No fevers, chills, sweats, or change in appetite Eyes: No visual changes, double vision, eye pain Ear, nose and throat: No hearing loss, ear pain, nasal congestion, sore throat Cardiovascular: No chest pain, palpitations Respiratory:  No shortness of breath at rest or with exertion.   No wheezes.  She has snoring. GastrointestinaI: No nausea, vomiting, diarrhea, abdominal pain, fecal incontinence Genitourinary:  No dysuria, urinary retention or frequency.  No  nocturia. Musculoskeletal:  No neck pain, back pain Integumentary: No rash, pruritus, skin lesions Neurological: as above Psychiatric: No depression at this time.  She has some anxiety Endocrine: No palpitations, diaphoresis, change in appetite, change in weigh or increased thirst Hematologic/Lymphatic:  No anemia, purpura, petechiae. Allergic/Immunologic: No itchy/runny eyes, nasal congestion, recent allergic reactions, rashes  ALLERGIES: Allergies  Allergen Reactions   Codeine     HOME MEDICATIONS:  Current Outpatient Medications:    amLODipine (NORVASC) 5 MG tablet, Take 1 tablet (5 mg total) by mouth daily., Disp: 90 tablet, Rfl:    busPIRone (BUSPAR) 10 MG tablet, Take 10 mg by mouth 2 (two) times daily., Disp: , Rfl:    estradiol (CLIMARA - DOSED IN MG/24 HR) 0.0375 mg/24hr patch, Place 0.5 mg onto the skin 2 (two) times a week., Disp: , Rfl:    hydrochlorothiazide (HYDRODIURIL) 25 MG tablet, Take 25 mg by mouth daily., Disp: , Rfl:    ibuprofen (ADVIL) 600 MG tablet, Take 1 tablet (600 mg total) by mouth every 6 (six) hours as needed., Disp: 30 tablet, Rfl: 0   Loratadine 10 MG CAPS, Take 10 mg by mouth daily., Disp: , Rfl:    metFORMIN (GLUCOPHAGE) 500 MG tablet, Take 1 tablet (500 mg total) by mouth 2 (two) times daily with a meal. (Patient taking differently: Take 500 mg by mouth daily.), Disp: 180 tablet, Rfl: 0   Methylcellulose, Laxative, (CITRUCEL PO), Take 2 tablets by mouth daily. , Disp: , Rfl:    Multiple Vitamin (MULTIVITAMIN) tablet, Take 1 tablet by mouth daily. , Disp: , Rfl:    omeprazole (PRILOSEC) 40 MG capsule, Take 40 mg by mouth daily., Disp: , Rfl:    progesterone (PROMETRIUM) 100 MG capsule, Take 100 mg by mouth daily., Disp: , Rfl:    Vitamin D, Ergocalciferol, (DRISDOL) 1.25 MG (50000 UNIT) CAPS capsule, Take 1 capsule (50,000 Units total) by mouth every 7 (seven) days., Disp: 12 capsule, Rfl: 0  PAST MEDICAL HISTORY: Past Medical History:  Diagnosis  Date   Anxiety    B12 deficiency    Back pain    Chest pain    Depression    Heartburn    Hx of blood clots    Hypertension, essential    Joint pain    Lactose intolerance    Obesity    PMDD (premenstrual dysphoric disorder)    SOBOE (shortness of breath on exertion)    Swelling of both lower extremities    Vitamin D deficiency     PAST SURGICAL HISTORY: Past Surgical History:  Procedure Laterality Date   GASTRIC BYPASS  2006    FAMILY HISTORY: Family History  Problem Relation Age of Onset  Cancer Mother 64       kidney   Diabetes Mother    Hypertension Mother    Kidney disease Mother    Thyroid disease Mother    Kidney cancer Mother    Anxiety disorder Mother    Obesity Mother    Heart attack Father 67   Heart disease Father    Depression Brother    Heart attack Brother    Heart disease Brother     SOCIAL HISTORY: Social History   Socioeconomic History   Marital status: Married    Spouse name: Hayla Hinger   Number of children: Not on file   Years of education: Not on file   Highest education level: Not on file  Occupational History   Occupation: Veterinary surgeon in [employer]  Tobacco Use   Smoking status: Never   Smokeless tobacco: Never  Vaping Use   Vaping status: Not on file  Substance and Sexual Activity   Alcohol use: Yes    Comment: occasional liquor   Drug use: No   Sexual activity: Yes    Birth control/protection: Surgical    Comment: VAS  Other Topics Concern   Not on file  Social History Narrative   Pt lives with husband    Pt works    Social Drivers of Corporate investment banker Strain: Not on Ship broker Insecurity: Not on file  Transportation Needs: Not on file  Physical Activity: Not on file  Stress: Not on file  Social Connections: Not on file  Intimate Partner Violence: Not on file       PHYSICAL EXAM  Vitals:   12/30/23 1436  Weight: 161 lb (73 kg)  Height: 5\' 3"  (1.6 m)    Body mass index is 28.52  kg/m.    Orthostatic VS for the past 72 hrs (Last 3 readings):  Orthostatic BP BP Location Cuff Size Orthostatic Pulse  12/30/23 1442 108/72 -- -- 81  12/30/23 1439 118/75 -- -- 80  12/30/23 1436 115/64 Left Arm Large 79    General: The patient is well-developed and well-nourished and in no acute distress  HEENT:  Head is Ma[place]/AT.  Sclera are anicteric.     Skin: Extremities are without rash or  edema.   Neurologic Exam  Mental status: The patient is alert and oriented x 3 at the time of the examination. The patient has apparent normal recent and remote memory, with an apparently normal attention span and concentration ability.   Speech is normal.  Cranial nerves: Extraocular movements are full. Pupils are equal, round, and reactive to light and accomodation.  Visual fields are full.  Facial symmetry is present. There is good facial sensation to soft touch bilaterally.Facial strength is normal.  Trapezius and sternocleidomastoid strength is normal. No dysarthria is noted.  . No obvious hearing deficits are noted.  Motor:  Muscle bulk is normal.   Tone is normal. Strength is  5 / 5 in all 4 extremities.   Sensory: Sensory testing is intact to pinprick, soft touch and vibration sensation in all 4 extremities.  Coordination: Cerebellar testing reveals good finger-nose-finger and heel-to-shin bilaterally.  Gait and station: Station is normal.   Gait is normal. Tandem gait is normal. Romberg is negative.   Reflexes: Deep tendon reflexes are symmetric and normal bilaterally.   Plantar responses are flexor.    DIAGNOSTIC DATA (LABS, IMAGING, TESTING) - I reviewed patient records, labs, notes, testing and imaging myself where available.  Lab Results  Component  Value Date   WBC 6.8 09/27/2022   HGB 12.6 09/27/2022   HCT 38 09/27/2022   MCV 90 10/20/2020   PLT 441 (A) 09/27/2022      Component Value Date/Time   NA 137 09/27/2022 0000   K 4.0 09/27/2022 0000   CL 101 09/27/2022  0000   CO2 9 (A) 09/27/2022 0000   GLUCOSE 78 10/23/2021 0851   GLUCOSE 88 01/09/2013 0744   BUN 12 09/27/2022 0000   CREATININE 0.7 09/27/2022 0000   CREATININE 0.69 10/23/2021 0851   CREATININE 0.59 01/09/2013 0744   CALCIUM 9.5 10/23/2021 0851   PROT 6.6 10/23/2021 0851   ALBUMIN 4.3 09/27/2022 0000   ALBUMIN 4.4 10/23/2021 0851   AST 28 09/27/2022 0000   ALT 30 09/27/2022 0000   ALKPHOS 65 10/23/2021 0851   BILITOT 0.8 10/23/2021 0851   GFRNONAA 99 06/08/2021 0000   GFRAA 115 10/20/2020 1028   Lab Results  Component Value Date   CHOL 185 09/27/2022   HDL 84 (A) 09/27/2022   LDLCALC 83 09/27/2022   TRIG 103 09/27/2022   CHOLHDL 2.6 10/23/2021   Lab Results  Component Value Date   HGBA1C 5.5 10/23/2021   Lab Results  Component Value Date   VITAMINB12 805 09/27/2022   Lab Results  Component Value Date   TSH 1.48 09/27/2022       ASSESSMENT AND PLAN  Cognitive complaints  LOC (loss of consciousness) (HCC)  Snoring - Plan: Home sleep test  Excessive daytime sleepiness - Plan: Home sleep test  She is reporting reduced short-term memory.  I believe this is more likely to be due to reduced focus/attention rather than a neurodegenerative process.  If this worsens she may need evaluation by psychiatry for attention deficit. She has snoring with excessive daytime sleepiness.  It is possible that this is affecting her focus and attention as well.  We need to check a home sleep study to determine if she has obstructive sleep apnea and consider CPAP or other treatment based on results.  If she has started on CPAP we will schedule follow-up She has had no further spells of syncope.  Etiology is uncertain. Return as needed for new or worsening neurologic symptoms.  Chace Bisch A. Epimenio Foot, MD, [facility] 12/30/2023, 4:58 PM Certified in Neurology, Clinical Neurophysiology, Sleep Medicine and Neuroimaging  [facility] [address] [place], [place]  [address] [phone]

## 2024-01-16 ENCOUNTER — Other Ambulatory Visit: Payer: Self-pay | Admitting: Obstetrics and Gynecology

## 2024-01-16 DIAGNOSIS — Z1231 Encounter for screening mammogram for malignant neoplasm of breast: Secondary | ICD-10-CM

## 2024-02-04 ENCOUNTER — Ambulatory Visit
Admission: RE | Admit: 2024-02-04 | Discharge: 2024-02-04 | Disposition: A | Discharge status: Home / Self Care | Source: Ambulatory Visit | Attending: Obstetrics and Gynecology | Admitting: Obstetrics and Gynecology

## 2024-02-04 DIAGNOSIS — Z1231 Encounter for screening mammogram for malignant neoplasm of breast: Secondary | ICD-10-CM

## 2024-08-17 ENCOUNTER — Other Ambulatory Visit (HOSPITAL_COMMUNITY): Payer: Self-pay | Admitting: Family Medicine

## 2024-08-17 DIAGNOSIS — R0789 Other chest pain: Secondary | ICD-10-CM

## 2024-08-24 ENCOUNTER — Telehealth (HOSPITAL_COMMUNITY): Payer: Self-pay | Admitting: Emergency Medicine

## 2024-08-24 DIAGNOSIS — R079 Chest pain, unspecified: Secondary | ICD-10-CM

## 2024-08-24 MED ORDER — METOPROLOL TARTRATE 25 MG PO TABS: 25.0000 mg | ORAL_TABLET | Freq: Once | ORAL | 0 refills | Status: AC

## 2024-08-24 NOTE — Telephone Encounter (Signed)
Reaching out to patient to offer assistance regarding upcoming cardiac imaging study; pt verbalizes understanding of appt date/time, parking situation and where to check in, pre-test NPO status and medications ordered, and verified current allergies; name and call back number provided for further questions should they arise Marchia Bond RN Navigator Cardiac Imaging [facility] [phone] office [phone] cell  25mg  metoprolol

## 2024-08-25 ENCOUNTER — Other Ambulatory Visit: Payer: Self-pay | Admitting: Cardiology

## 2024-08-25 ENCOUNTER — Ambulatory Visit (HOSPITAL_COMMUNITY)
Admission: RE | Admit: 2024-08-25 | Discharge: 2024-08-25 | Disposition: A | Discharge status: Home / Self Care | Source: Ambulatory Visit | Attending: Cardiovascular Disease | Admitting: Cardiovascular Disease

## 2024-08-25 ENCOUNTER — Ambulatory Visit (HOSPITAL_BASED_OUTPATIENT_CLINIC_OR_DEPARTMENT_OTHER)
Admission: RE | Admit: 2024-08-25 | Discharge: 2024-08-25 | Disposition: A | Discharge status: Home / Self Care | Source: Ambulatory Visit | Attending: Cardiology | Admitting: Cardiology

## 2024-08-25 DIAGNOSIS — R931 Abnormal findings on diagnostic imaging of heart and coronary circulation: Secondary | ICD-10-CM | POA: Insufficient documentation

## 2024-08-25 DIAGNOSIS — R0789 Other chest pain: Secondary | ICD-10-CM | POA: Insufficient documentation

## 2024-08-25 DIAGNOSIS — I2510 Atherosclerotic heart disease of native coronary artery without angina pectoris: Secondary | ICD-10-CM | POA: Diagnosis not present

## 2024-08-25 MED ORDER — NITROGLYCERIN 0.4 MG SL SUBL
0.8000 mg | SUBLINGUAL_TABLET | Freq: Once | SUBLINGUAL | Status: AC
  Administered 2024-08-25: 0.8 mg via SUBLINGUAL

## 2024-08-25 MED ORDER — DILTIAZEM HCL 25 MG/5ML IV SOLN: 10.0000 mg | INTRAVENOUS | Status: DC | PRN

## 2024-08-25 MED ORDER — IOHEXOL 350 MG/ML SOLN
100.0000 mL | Freq: Once | INTRAVENOUS | Status: AC | PRN
  Administered 2024-08-25: 100 mL via INTRAVENOUS

## 2024-08-25 MED ORDER — METOPROLOL TARTRATE 5 MG/5ML IV SOLN
10.0000 mg | Freq: Once | INTRAVENOUS | Status: AC | PRN
  Administered 2024-08-25: 10 mg via INTRAVENOUS
